# Patient Record
Sex: Female | Born: 1950 | Race: White | Hispanic: No | Marital: Single | Smoking: Never smoker
Health system: Southern US, Community
[De-identification: ages and names within clinical notes are randomized; demographics above are authoritative.]

## PROBLEM LIST (undated history)

## (undated) DIAGNOSIS — J349 Unspecified disorder of nose and nasal sinuses: Secondary | ICD-10-CM

## (undated) DIAGNOSIS — D0510 Intraductal carcinoma in situ of unspecified breast: Secondary | ICD-10-CM

## (undated) DIAGNOSIS — I10 Essential (primary) hypertension: Secondary | ICD-10-CM

## (undated) DIAGNOSIS — G5600 Carpal tunnel syndrome, unspecified upper limb: Secondary | ICD-10-CM

## (undated) DIAGNOSIS — M1990 Unspecified osteoarthritis, unspecified site: Secondary | ICD-10-CM

## (undated) HISTORY — DX: Carpal tunnel syndrome, unspecified upper limb: G56.00

## (undated) HISTORY — DX: Intraductal carcinoma in situ of unspecified breast: D05.10

## (undated) HISTORY — DX: Unspecified disorder of nose and nasal sinuses: J34.9

## (undated) HISTORY — DX: Unspecified osteoarthritis, unspecified site: M19.90

## (undated) HISTORY — DX: Essential (primary) hypertension: I10

## (undated) HISTORY — PX: CARPAL TUNNEL RELEASE: SHX101

## (undated) HISTORY — PX: TONSILLECTOMY: SUR1361

---

## 1998-02-17 ENCOUNTER — Other Ambulatory Visit: Admission: RE | Admit: 1998-02-17 | Discharge: 1998-02-17 | Payer: Self-pay | Admitting: Obstetrics and Gynecology

## 1999-05-06 ENCOUNTER — Ambulatory Visit (HOSPITAL_BASED_OUTPATIENT_CLINIC_OR_DEPARTMENT_OTHER): Admission: RE | Admit: 1999-05-06 | Discharge: 1999-05-06 | Payer: Self-pay | Admitting: Orthopedic Surgery

## 1999-05-31 ENCOUNTER — Other Ambulatory Visit: Admission: RE | Admit: 1999-05-31 | Discharge: 1999-05-31 | Payer: Self-pay | Admitting: Obstetrics and Gynecology

## 2000-07-16 ENCOUNTER — Other Ambulatory Visit: Admission: RE | Admit: 2000-07-16 | Discharge: 2000-07-16 | Payer: Self-pay | Admitting: Obstetrics and Gynecology

## 2000-12-30 ENCOUNTER — Emergency Department (HOSPITAL_COMMUNITY): Admission: EM | Admit: 2000-12-30 | Discharge: 2000-12-30 | Payer: Self-pay | Admitting: Emergency Medicine

## 2001-07-18 ENCOUNTER — Other Ambulatory Visit: Admission: RE | Admit: 2001-07-18 | Discharge: 2001-07-18 | Payer: Self-pay | Admitting: Obstetrics and Gynecology

## 2002-09-17 ENCOUNTER — Other Ambulatory Visit: Admission: RE | Admit: 2002-09-17 | Discharge: 2002-09-17 | Payer: Self-pay | Admitting: Obstetrics and Gynecology

## 2003-09-23 ENCOUNTER — Other Ambulatory Visit: Admission: RE | Admit: 2003-09-23 | Discharge: 2003-09-23 | Payer: Self-pay | Admitting: Obstetrics and Gynecology

## 2004-10-14 ENCOUNTER — Other Ambulatory Visit: Admission: RE | Admit: 2004-10-14 | Discharge: 2004-10-14 | Payer: Self-pay | Admitting: Obstetrics and Gynecology

## 2005-12-05 ENCOUNTER — Other Ambulatory Visit: Admission: RE | Admit: 2005-12-05 | Discharge: 2005-12-05 | Payer: Self-pay | Admitting: Obstetrics and Gynecology

## 2006-12-06 ENCOUNTER — Other Ambulatory Visit: Admission: RE | Admit: 2006-12-06 | Discharge: 2006-12-06 | Payer: Self-pay | Admitting: Obstetrics & Gynecology

## 2007-05-09 DIAGNOSIS — D0510 Intraductal carcinoma in situ of unspecified breast: Secondary | ICD-10-CM

## 2007-05-09 HISTORY — PX: BREAST LUMPECTOMY: SHX2

## 2007-05-09 HISTORY — DX: Intraductal carcinoma in situ of unspecified breast: D05.10

## 2007-11-18 ENCOUNTER — Encounter: Admission: RE | Admit: 2007-11-18 | Discharge: 2007-11-18 | Payer: Self-pay | Admitting: Radiology

## 2007-12-09 ENCOUNTER — Other Ambulatory Visit: Admission: RE | Admit: 2007-12-09 | Discharge: 2007-12-09 | Payer: Self-pay | Admitting: Obstetrics and Gynecology

## 2007-12-10 ENCOUNTER — Encounter (INDEPENDENT_AMBULATORY_CARE_PROVIDER_SITE_OTHER): Payer: Self-pay | Admitting: General Surgery

## 2007-12-10 ENCOUNTER — Ambulatory Visit (HOSPITAL_COMMUNITY): Admission: RE | Admit: 2007-12-10 | Discharge: 2007-12-10 | Payer: Self-pay | Admitting: General Surgery

## 2007-12-12 ENCOUNTER — Ambulatory Visit: Payer: Self-pay | Admitting: Hematology

## 2007-12-24 ENCOUNTER — Ambulatory Visit: Admission: RE | Admit: 2007-12-24 | Discharge: 2008-03-03 | Payer: Self-pay | Admitting: Radiation Oncology

## 2008-03-03 ENCOUNTER — Ambulatory Visit: Payer: Self-pay | Admitting: Hematology

## 2008-03-05 LAB — CBC WITH DIFFERENTIAL/PLATELET
BASO%: 0.5 % (ref 0.0–2.0)
HCT: 42 % (ref 34.8–46.6)
MCHC: 34.6 g/dL (ref 32.0–36.0)
MONO#: 0.3 10*3/uL (ref 0.1–0.9)
NEUT%: 64.8 % (ref 39.6–76.8)
WBC: 3.1 10*3/uL — ABNORMAL LOW (ref 3.9–10.0)
lymph#: 0.6 10*3/uL — ABNORMAL LOW (ref 0.9–3.3)

## 2008-03-05 LAB — COMPREHENSIVE METABOLIC PANEL
ALT: 11 U/L (ref 0–35)
Albumin: 4.2 g/dL (ref 3.5–5.2)
CO2: 28 mEq/L (ref 19–32)
Calcium: 9.3 mg/dL (ref 8.4–10.5)
Chloride: 105 mEq/L (ref 96–112)
Creatinine, Ser: 0.89 mg/dL (ref 0.40–1.20)
Sodium: 142 mEq/L (ref 135–145)
Total Protein: 6.5 g/dL (ref 6.0–8.3)

## 2008-05-04 ENCOUNTER — Ambulatory Visit: Payer: Self-pay | Admitting: Hematology

## 2008-05-06 LAB — COMPREHENSIVE METABOLIC PANEL
ALT: 11 U/L (ref 0–35)
BUN: 14 mg/dL (ref 6–23)
CO2: 29 mEq/L (ref 19–32)
Calcium: 8.8 mg/dL (ref 8.4–10.5)
Chloride: 105 mEq/L (ref 96–112)
Creatinine, Ser: 0.87 mg/dL (ref 0.40–1.20)
Glucose, Bld: 95 mg/dL (ref 70–99)

## 2008-05-06 LAB — CBC WITH DIFFERENTIAL/PLATELET
Basophils Absolute: 0 10*3/uL (ref 0.0–0.1)
Eosinophils Absolute: 0.1 10*3/uL (ref 0.0–0.5)
HCT: 40.5 % (ref 34.8–46.6)
HGB: 13.8 g/dL (ref 11.6–15.9)
MONO#: 0.3 10*3/uL (ref 0.1–0.9)
NEUT%: 62.8 % (ref 39.6–76.8)
WBC: 3.1 10*3/uL — ABNORMAL LOW (ref 3.9–10.0)
lymph#: 0.7 10*3/uL — ABNORMAL LOW (ref 0.9–3.3)

## 2008-08-03 ENCOUNTER — Ambulatory Visit: Payer: Self-pay | Admitting: Hematology

## 2008-08-06 LAB — CBC WITH DIFFERENTIAL/PLATELET
Basophils Absolute: 0 10*3/uL (ref 0.0–0.1)
EOS%: 2.8 % (ref 0.0–7.0)
HCT: 41.1 % (ref 34.8–46.6)
HGB: 14.1 g/dL (ref 11.6–15.9)
LYMPH%: 32.2 % (ref 14.0–49.7)
MCH: 30.5 pg (ref 25.1–34.0)
MONO#: 0.3 10*3/uL (ref 0.1–0.9)
NEUT%: 55.5 % (ref 38.4–76.8)
Platelets: 192 10*3/uL (ref 145–400)
lymph#: 1 10*3/uL (ref 0.9–3.3)

## 2008-08-06 LAB — COMPREHENSIVE METABOLIC PANEL
BUN: 16 mg/dL (ref 6–23)
CO2: 29 mEq/L (ref 19–32)
Calcium: 9.4 mg/dL (ref 8.4–10.5)
Chloride: 104 mEq/L (ref 96–112)
Creatinine, Ser: 0.95 mg/dL (ref 0.40–1.20)
Total Bilirubin: 0.4 mg/dL (ref 0.3–1.2)

## 2008-12-14 ENCOUNTER — Ambulatory Visit: Payer: Self-pay | Admitting: Hematology

## 2008-12-16 LAB — COMPREHENSIVE METABOLIC PANEL
AST: 14 U/L (ref 0–37)
BUN: 14 mg/dL (ref 6–23)
Calcium: 8.9 mg/dL (ref 8.4–10.5)
Chloride: 104 mEq/L (ref 96–112)
Creatinine, Ser: 0.87 mg/dL (ref 0.40–1.20)
Total Bilirubin: 0.4 mg/dL (ref 0.3–1.2)

## 2008-12-16 LAB — CBC WITH DIFFERENTIAL/PLATELET
BASO%: 0.5 % (ref 0.0–2.0)
Basophils Absolute: 0 10*3/uL (ref 0.0–0.1)
EOS%: 2.4 % (ref 0.0–7.0)
HCT: 41.4 % (ref 34.8–46.6)
HGB: 14 g/dL (ref 11.6–15.9)
LYMPH%: 29.5 % (ref 14.0–49.7)
MCH: 29.8 pg (ref 25.1–34.0)
MCHC: 33.8 g/dL (ref 31.5–36.0)
MCV: 88.3 fL (ref 79.5–101.0)
MONO%: 7.4 % (ref 0.0–14.0)
NEUT%: 60.2 % (ref 38.4–76.8)
Platelets: 166 10*3/uL (ref 145–400)
lymph#: 1 10*3/uL (ref 0.9–3.3)

## 2009-04-08 ENCOUNTER — Ambulatory Visit: Payer: Self-pay | Admitting: Oncology

## 2009-04-12 LAB — COMPREHENSIVE METABOLIC PANEL
ALT: 10 U/L (ref 0–35)
AST: 13 U/L (ref 0–37)
Alkaline Phosphatase: 43 U/L (ref 39–117)
BUN: 16 mg/dL (ref 6–23)
Chloride: 104 mEq/L (ref 96–112)
Creatinine, Ser: 0.83 mg/dL (ref 0.40–1.20)

## 2009-04-12 LAB — CBC WITH DIFFERENTIAL/PLATELET
BASO%: 0.5 % (ref 0.0–2.0)
Basophils Absolute: 0 10*3/uL (ref 0.0–0.1)
EOS%: 3.2 % (ref 0.0–7.0)
HCT: 40.4 % (ref 34.8–46.6)
HGB: 13.8 g/dL (ref 11.6–15.9)
MCH: 30.7 pg (ref 25.1–34.0)
MCHC: 34.3 g/dL (ref 31.5–36.0)
MCV: 89.6 fL (ref 79.5–101.0)
MONO%: 9.4 % (ref 0.0–14.0)
NEUT%: 53.5 % (ref 38.4–76.8)
RDW: 13.6 % (ref 11.2–14.5)
lymph#: 1.2 10*3/uL (ref 0.9–3.3)

## 2009-07-27 ENCOUNTER — Ambulatory Visit: Payer: Self-pay | Admitting: Oncology

## 2009-07-29 LAB — COMPREHENSIVE METABOLIC PANEL
ALT: 10 U/L (ref 0–35)
AST: 14 U/L (ref 0–37)
Albumin: 3.9 g/dL (ref 3.5–5.2)
BUN: 13 mg/dL (ref 6–23)
Calcium: 9.2 mg/dL (ref 8.4–10.5)
Chloride: 106 mEq/L (ref 96–112)
Potassium: 4 mEq/L (ref 3.5–5.3)
Sodium: 141 mEq/L (ref 135–145)
Total Protein: 6.5 g/dL (ref 6.0–8.3)

## 2009-07-29 LAB — CBC WITH DIFFERENTIAL/PLATELET
Basophils Absolute: 0 10*3/uL (ref 0.0–0.1)
EOS%: 3.3 % (ref 0.0–7.0)
HGB: 13.8 g/dL (ref 11.6–15.9)
MCH: 30.3 pg (ref 25.1–34.0)
NEUT#: 1.5 10*3/uL (ref 1.5–6.5)
RBC: 4.53 10*6/uL (ref 3.70–5.45)
RDW: 13.1 % (ref 11.2–14.5)
lymph#: 1.2 10*3/uL (ref 0.9–3.3)

## 2009-11-01 ENCOUNTER — Ambulatory Visit: Payer: Self-pay | Admitting: Oncology

## 2009-11-03 LAB — COMPREHENSIVE METABOLIC PANEL
AST: 18 U/L (ref 0–37)
Albumin: 4 g/dL (ref 3.5–5.2)
BUN: 15 mg/dL (ref 6–23)
CO2: 26 mEq/L (ref 19–32)
Calcium: 9.4 mg/dL (ref 8.4–10.5)
Glucose, Bld: 86 mg/dL (ref 70–99)
Sodium: 142 mEq/L (ref 135–145)

## 2009-11-03 LAB — CBC WITH DIFFERENTIAL/PLATELET
BASO%: 0.5 % (ref 0.0–2.0)
EOS%: 2.4 % (ref 0.0–7.0)
HCT: 40.2 % (ref 34.8–46.6)
LYMPH%: 41.4 % (ref 14.0–49.7)
MCH: 30.3 pg (ref 25.1–34.0)
MCHC: 34.4 g/dL (ref 31.5–36.0)
MCV: 88.1 fL (ref 79.5–101.0)
MONO#: 0.3 10*3/uL (ref 0.1–0.9)
MONO%: 9.3 % (ref 0.0–14.0)
NEUT%: 46.4 % (ref 38.4–76.8)
Platelets: 174 10*3/uL (ref 145–400)
RBC: 4.56 10*6/uL (ref 3.70–5.45)
WBC: 3.4 10*3/uL — ABNORMAL LOW (ref 3.9–10.3)

## 2010-02-02 ENCOUNTER — Ambulatory Visit (HOSPITAL_BASED_OUTPATIENT_CLINIC_OR_DEPARTMENT_OTHER): Payer: Federal, State, Local not specified - PPO | Admitting: Oncology

## 2010-02-04 LAB — CBC WITH DIFFERENTIAL/PLATELET
Basophils Absolute: 0 10*3/uL (ref 0.0–0.1)
Eosinophils Absolute: 0.1 10*3/uL (ref 0.0–0.5)
HCT: 38.2 % (ref 34.8–46.6)
LYMPH%: 41.6 % (ref 14.0–49.7)
MCV: 88.3 fL (ref 79.5–101.0)
MONO#: 0.3 10*3/uL (ref 0.1–0.9)
MONO%: 9.6 % (ref 0.0–14.0)
NEUT#: 1.2 10*3/uL — ABNORMAL LOW (ref 1.5–6.5)
NEUT%: 45.6 % (ref 38.4–76.8)
Platelets: 166 10*3/uL (ref 145–400)
RBC: 4.32 10*6/uL (ref 3.70–5.45)
WBC: 2.7 10*3/uL — ABNORMAL LOW (ref 3.9–10.3)

## 2010-06-09 ENCOUNTER — Encounter (HOSPITAL_BASED_OUTPATIENT_CLINIC_OR_DEPARTMENT_OTHER): Payer: Federal, State, Local not specified - PPO | Admitting: Oncology

## 2010-06-09 DIAGNOSIS — D72819 Decreased white blood cell count, unspecified: Secondary | ICD-10-CM

## 2010-06-09 DIAGNOSIS — D0590 Unspecified type of carcinoma in situ of unspecified breast: Secondary | ICD-10-CM

## 2010-06-09 DIAGNOSIS — Z170 Estrogen receptor positive status [ER+]: Secondary | ICD-10-CM

## 2010-06-09 DIAGNOSIS — Z23 Encounter for immunization: Secondary | ICD-10-CM

## 2010-06-09 LAB — CBC WITH DIFFERENTIAL/PLATELET
BASO%: 0.7 % (ref 0.0–2.0)
HCT: 39.8 % (ref 34.8–46.6)
LYMPH%: 36.5 % (ref 14.0–49.7)
MCHC: 33.9 g/dL (ref 31.5–36.0)
MCV: 87.7 fL (ref 79.5–101.0)
MONO%: 8.3 % (ref 0.0–14.0)
NEUT%: 51.1 % (ref 38.4–76.8)
Platelets: 185 10*3/uL (ref 145–400)
RBC: 4.55 10*6/uL (ref 3.70–5.45)

## 2010-06-09 LAB — COMPREHENSIVE METABOLIC PANEL
Alkaline Phosphatase: 40 U/L (ref 39–117)
CO2: 28 mEq/L (ref 19–32)
Creatinine, Ser: 0.9 mg/dL (ref 0.40–1.20)
Glucose, Bld: 96 mg/dL (ref 70–99)
Sodium: 142 mEq/L (ref 135–145)
Total Bilirubin: 0.5 mg/dL (ref 0.3–1.2)
Total Protein: 6.4 g/dL (ref 6.0–8.3)

## 2010-09-16 ENCOUNTER — Encounter (INDEPENDENT_AMBULATORY_CARE_PROVIDER_SITE_OTHER): Payer: Self-pay | Admitting: General Surgery

## 2010-09-20 NOTE — Op Note (Signed)
Bonnie Collins, Bonnie Collins             ACCOUNT NO.:  [id number]   MEDICAL RECORD NO.:  [id number]          PATIENT TYPE:  AMB   LOCATION:  SDS                          FACILITY:  [facility]   PHYSICIAN:  Angelia Mould. Derrell Lolling, M.D.DATE OF BIRTH:  13-Nov-1950   DATE OF PROCEDURE:  12/10/2007  DATE OF DISCHARGE:                               OPERATIVE REPORT   PREOPERATIVE DIAGNOSIS:  Ductal carcinoma in situ, left breast.   POSTOPERATIVE DIAGNOSIS:  Ductal carcinoma in situ, left breast.   OPERATION PERFORMED:  Left partial mastectomy with needle localization  and specimen mammogram.   SURGEON:  Angelia Mould. Derrell Lolling, MD   OPERATIVE INDICATIONS:  This is a 60 year old white female who had  screening mammograms recently, which showed focal area of cluster  microcalcifications in the upper outer quadrant of the left breast.  Breast specific gamma imaging showed a very faint area in the upper  outer quadrant.  MRI showed a solitary area in the upper outer quadrant  correlating with her known DCIS being 1.4 cm.  Nodes were negative.  It  was her desire to have breast conservation surgery.  She is brought to  the operating room electively.   OPERATIVE TECHNIQUE:  The patient underwent needle localization by  Jeralyn Ruths this morning and that went well.  She is brought to  operating room.  A general anesthesia was induced.  The left breast was  prepped and draped in the sterile fashion.  The patient was identified  as correct patient and correct procedure.  Intravenous antibiotics were  given.   I observed the images that had the wire in place entering in the far  lateral area at about 10 o'clock position and directed medially.  Using  the marking pen, I marked a circumareolar incision in the far lateral  upper outer quadrant.  0.5% Marcaine with epinephrine was used as local  infiltration anesthetic.  A curved circumareolar incision was made in  the far lateral left breast.  Dissection was  carried down into the  breast tissue and using electrocautery, I dissected widely around the  localizing wire.  The specimen was removed and marked with a 6-color  margin marker kit.  Specimen mammogram was performed by Dr. Yolanda Bonine,  and she said that the marker clip was in the center of the specimen.  The specimen was then sent for routine histology.  The wound was  irrigated with saline.  Hemostasis was excellent and achieved  electrocautery.  The deeper breast tissue was closed with interrupted  sutures of 3-0 Vicryl and skin closed with running subcuticular suture  of 4-0 Monocryl and Dermabond.  Clean bandages were placed, and the  patient taken to the recovery room in stable condition.  Estimated blood  loss was about 10 mL.  Complications, none.  Sponge, needle, and  instrument counts were correct.     Angelia Mould. Derrell Lolling, M.D.  Electronically Signed    HMI/MEDQ  D:  12/10/2007  T:  12/11/2007  Job:  [id number]   cc:   Gloriajean Dell. Andrey Campanile, M.D.

## 2010-10-20 ENCOUNTER — Other Ambulatory Visit: Payer: Self-pay | Admitting: Oncology

## 2010-10-20 ENCOUNTER — Encounter (HOSPITAL_BASED_OUTPATIENT_CLINIC_OR_DEPARTMENT_OTHER): Payer: Federal, State, Local not specified - PPO | Admitting: Oncology

## 2010-10-20 DIAGNOSIS — D0590 Unspecified type of carcinoma in situ of unspecified breast: Secondary | ICD-10-CM

## 2010-10-20 DIAGNOSIS — D72819 Decreased white blood cell count, unspecified: Secondary | ICD-10-CM

## 2010-10-20 LAB — COMPREHENSIVE METABOLIC PANEL
ALT: 10 U/L (ref 0–35)
AST: 16 U/L (ref 0–37)
Albumin: 4.1 g/dL (ref 3.5–5.2)
Alkaline Phosphatase: 40 U/L (ref 39–117)
Calcium: 9.2 mg/dL (ref 8.4–10.5)
Chloride: 105 mEq/L (ref 96–112)
Potassium: 3.9 mEq/L (ref 3.5–5.3)

## 2010-10-20 LAB — CBC WITH DIFFERENTIAL/PLATELET
BASO%: 0.4 % (ref 0.0–2.0)
EOS%: 2.6 % (ref 0.0–7.0)
MCH: 29.6 pg (ref 25.1–34.0)
MCHC: 33.8 g/dL (ref 31.5–36.0)
MCV: 87.7 fL (ref 79.5–101.0)
MONO%: 8.7 % (ref 0.0–14.0)
RBC: 4.53 10*6/uL (ref 3.70–5.45)
RDW: 14.1 % (ref 11.2–14.5)
lymph#: 1.1 10*3/uL (ref 0.9–3.3)

## 2010-12-22 ENCOUNTER — Ambulatory Visit (INDEPENDENT_AMBULATORY_CARE_PROVIDER_SITE_OTHER): Payer: Federal, State, Local not specified - PPO | Admitting: General Surgery

## 2010-12-22 ENCOUNTER — Encounter (INDEPENDENT_AMBULATORY_CARE_PROVIDER_SITE_OTHER): Payer: Self-pay | Admitting: General Surgery

## 2010-12-22 VITALS — BP 122/84 | Temp 97.6°F | Wt 229.8 lb

## 2010-12-22 DIAGNOSIS — Z853 Personal history of malignant neoplasm of breast: Secondary | ICD-10-CM

## 2010-12-22 NOTE — Patient Instructions (Signed)
Your physical exam today is unremarkable, there is no sign of recurrent cancer anywhere. Your mammograms on October 31, 2010 looked good there is no focal abnormality. Continue taking the tamoxifen. Continue followup with Dr. Gaylyn Rong.  I will see you in one year after you gets your annual mammograms.

## 2010-12-22 NOTE — Progress Notes (Signed)
Chief Complaint  Patient presents with  . Breast Cancer Long Term Follow Up    DCIS left breast, UOQ    HPI Bonnie Collins is a 60 y.o. female.  This 59 year old Caucasian female underwent left partial mastectomy on December 10, 2007. She had ductal carcinoma in situ, intermediate grade, receptor positive. She has no known recurrence to date. She has no complaints about her breast.  She sees Dr. Gaylyn Rong on a regular basis, approximately every 4 months. She is tolerating tamoxifen. Recent mammograms on October 31, 2010 looked fine. No focal abnormality. HPI  Past Medical History  Diagnosis Date  . Carpal tunnel syndrome   . Sinus problem   . Lump in female breast     Past Surgical History  Procedure Date  . Carpal tunnel release 1993 & 2003  . Breast lumpectomy 2009    Family History  Problem Relation Age of Onset  . Diabetes Mother   . Heart disease Father     Social History History  Substance Use Topics  . Smoking status: Never Smoker   . Smokeless tobacco: Not on file  . Alcohol Use: Yes     OCCASIONALLY    No Known Allergies  Current Outpatient Prescriptions  Medication Sig Dispense Refill  . Calcium-Vitamin D-Vitamin K 500-500-40 MG-UNT-MCG CHEW Chew by mouth.        . Cetirizine HCl (ZYRTEC PO) Take by mouth.        . Multiple Vitamin (MULTIVITAMIN PO) Take by mouth. MULTIVITAMIN FOR WOMEN OVER 50       . NATURAL PSYLLIUM FIBER PO Take by mouth.        . Omega-3 Fatty Acids (FISH OIL) 1000 MG CAPS Take by mouth.        . tamoxifen (NOLVADEX) 20 MG tablet Take 20 mg by mouth 2 (two) times daily.          Review of Systems ROS 10 system review is systems is performed and is negative except as described above.Blood pressure 122/84, temperature 97.6 F (36.4 C), temperature source Temporal, weight 229 lb 12.8 oz (104.237 kg).  Physical Exam Physical Exam   Patient looks well and is in no distress.  Neck no adenopathy no mass or jugular venous  distention.  Lungs clear to auscultation.  Breasts- oth breasts are examined. There is a well-healed lumpectomy scar in the upper outer quadrant left breast. Otherwise there are no abnormalities, no palpable masses, no skin changes no axillary adenopathy.  Data Reviewed  Mammograms reviewed. Medical oncology notes reviewed.  Assessment    Ductal carcinoma in situ left breast, intermediate grade, receptor positive.  No evidence of recurrence 3 years following left partial mastectomy, adjuvant radiation therapy, and adjuvant tamoxifen therapy.    Plan    Continue tamoxifen. Continued followup with Dr. Gaylyn Rong.  Repeat mammograms in one year.  Ather request, I will see her annually.       Bonnie Collins M 12/22/2010, 12:11 PM

## 2011-02-03 LAB — COMPREHENSIVE METABOLIC PANEL
ALT: 17
Albumin: 4.1
Alkaline Phosphatase: 67
GFR calc Af Amer: >60
Potassium: 4.3
Sodium: 140
Total Protein: 7.1

## 2011-02-03 LAB — CANCER ANTIGEN 27.29: CA 27.29: 21

## 2011-02-03 LAB — CBC
Platelets: 200
RDW: 13.7

## 2011-02-03 LAB — URINALYSIS, ROUTINE W REFLEX MICROSCOPIC
Glucose, UA: NEGATIVE
Ketones, ur: NEGATIVE
Protein, ur: NEGATIVE
Urobilinogen, UA: 0.2

## 2011-02-03 LAB — DIFFERENTIAL
Basophils Absolute: 0
Basophils Relative: 1
Eosinophils Absolute: 0.1
Neutro Abs: 2.8
Neutrophils Relative %: 52

## 2011-02-03 LAB — URINE MICROSCOPIC-ADD ON

## 2011-02-03 LAB — LACTATE DEHYDROGENASE: LDH: 164

## 2011-03-10 ENCOUNTER — Encounter: Payer: Self-pay | Admitting: Oncology

## 2011-03-10 DIAGNOSIS — D0510 Intraductal carcinoma in situ of unspecified breast: Secondary | ICD-10-CM | POA: Insufficient documentation

## 2011-03-23 ENCOUNTER — Ambulatory Visit (HOSPITAL_BASED_OUTPATIENT_CLINIC_OR_DEPARTMENT_OTHER): Payer: Federal, State, Local not specified - PPO | Admitting: Oncology

## 2011-03-23 ENCOUNTER — Other Ambulatory Visit: Payer: Self-pay | Admitting: Oncology

## 2011-03-23 ENCOUNTER — Telehealth: Payer: Self-pay | Admitting: Oncology

## 2011-03-23 ENCOUNTER — Other Ambulatory Visit (HOSPITAL_BASED_OUTPATIENT_CLINIC_OR_DEPARTMENT_OTHER): Payer: Federal, State, Local not specified - PPO | Admitting: Lab

## 2011-03-23 VITALS — BP 130/82 | HR 69 | Temp 97.2°F | Ht 66.5 in | Wt 226.5 lb

## 2011-03-23 DIAGNOSIS — D709 Neutropenia, unspecified: Secondary | ICD-10-CM

## 2011-03-23 DIAGNOSIS — D0590 Unspecified type of carcinoma in situ of unspecified breast: Secondary | ICD-10-CM

## 2011-03-23 DIAGNOSIS — Z853 Personal history of malignant neoplasm of breast: Secondary | ICD-10-CM

## 2011-03-23 LAB — CBC WITH DIFFERENTIAL/PLATELET
BASO%: 1 % (ref 0.0–2.0)
Eosinophils Absolute: 0.1 10*3/uL (ref 0.0–0.5)
HCT: 39.1 % (ref 34.8–46.6)
LYMPH%: 40.4 % (ref 14.0–49.7)
MCHC: 33.7 g/dL (ref 31.5–36.0)
MCV: 88.8 fL (ref 79.5–101.0)
MONO#: 0.3 10*3/uL (ref 0.1–0.9)
MONO%: 10.5 % (ref 0.0–14.0)
NEUT%: 45.5 % (ref 38.4–76.8)
Platelets: 165 10*3/uL (ref 145–400)
RBC: 4.41 10*6/uL (ref 3.70–5.45)
WBC: 3.1 10*3/uL — ABNORMAL LOW (ref 3.9–10.3)

## 2011-03-23 LAB — COMPREHENSIVE METABOLIC PANEL
Alkaline Phosphatase: 44 U/L (ref 39–117)
CO2: 30 mEq/L (ref 19–32)
Creatinine, Ser: 0.87 mg/dL (ref 0.50–1.10)
Glucose, Bld: 78 mg/dL (ref 70–99)
Total Bilirubin: 0.4 mg/dL (ref 0.3–1.2)

## 2011-03-23 NOTE — Telephone Encounter (Signed)
gv pt appt schedule for may.  °

## 2011-03-23 NOTE — Progress Notes (Signed)
[facility] OFFICE PROGRESS NOTE  Pamelia Hoit, MD [address] [facility] [place] [address]   DIAGNOSIS:  History of multifocal intermediate grade ductal carcinoma in situ, measured total of 0.3 cm on lumpectomy dated 12/10/2007.  There was no evidence of invasive carcinoma.  Margins were negative.  ER 100%, PR 36%.  PRIOR THERAPY:  Status post lumpectomy, status post adjuvant radiation therapy.    CURRENT THERAPY:  Tamoxifen 20 mg p.o. daily started on 03/05/18.  INTERVAL HISTORY: Bonnie Collins 60 y.o. female returns for regular follow up.  She is taking tamoxifen without problem. She denies anorexia, weight loss, headache, drenching night sweat, cough, hemoptysis, mucositis, nausea vomiting, breast abnormality, abdominal pain, abdominal swelling, hematochezia, hematuria, vaginal bleeding, vaginal spotting, lower cavity swelling or pain.  She is very active. She recently adopted a strayed dog. She walks the dog everyday.  She goes to the [facility] and works out 5 times a week.  She denies any recurrent infections such as sinus pain, productive cough, skin abscess, skin rash, dysuria, hematuria, pyuria, diarrhea.   MEDICAL HISTORY: Past Medical History  Diagnosis Date  . Carpal tunnel syndrome   . Sinus problem   . DCIS (ductal carcinoma in situ) 2009    s/p lumpectomy; s/p adj XRT; on adj Tamoxifen    ALLERGIES:   has no known allergies.  MEDICATIONS: Current Outpatient Prescriptions  Medication Sig Dispense Refill  . Calcium-Vitamin D-Vitamin K 500-500-40 MG-UNT-MCG CHEW Chew by mouth.        . Cetirizine HCl (ZYRTEC PO) Take by mouth daily as needed.       . Multiple Vitamin (MULTIVITAMIN PO) Take by mouth. MULTIVITAMIN FOR WOMEN OVER 50       . NATURAL PSYLLIUM FIBER PO Take by mouth.        . Omega-3 Fatty Acids (FISH OIL) 1000 MG CAPS Take by mouth.        . tamoxifen (NOLVADEX) 20 MG tablet Take 20 mg by mouth 2 (two) times daily.           SURGICAL HISTORY:  Past Surgical History  Procedure Date  . Carpal tunnel release 1993 & 2003  . Breast lumpectomy 2009    REVIEW OF SYSTEMS:  Pertinent items are noted in HPI.   Filed Vitals:   03/23/11 1032  BP: 130/82  Pulse: 69  Temp: 97.2 F (36.2 C)   Wt Readings from Last 3 Encounters:  03/23/11 226 lb 8 oz (102.74 kg)  10/20/10 228 lb 5 oz (103.562 kg)  12/22/10 229 lb 12.8 oz (104.237 kg)    PHYSICAL EXAMINATION:  General:  well-nourished in no acute distress.  Eyes:  no scleral icterus.  ENT:  There were no oropharyngeal lesions.  Neck was without thyromegaly.  Lymphatics:  Negative cervical, supraclavicular or axillary adenopathy.  Respiratory: lungs were clear bilaterally without wheezing or crackles.  Cardiovascular:  Regular rate and rhythm, S1/S2, without murmur, rub or gallop.  There was no pedal edema.  GI:  abdomen was soft, flat, nontender, nondistended, without organomegaly.  Muscoloskeletal:  no spinal tenderness of palpation of vertebral spine.  Skin exam was without echymosis, petichae.  Neuro exam was nonfocal.  Patient was able to get on and off exam table without assistance.  Gait was normal.  Patient was alerted and oriented.  Attention was good.   Language was appropriate.  Mood was normal without depression.  Speech was not pressured.  Thought content was not tangential.  Bilateral breast exam was negative.   LABORATORY/RADIOLOGY DATA:  Lab Results  Component Value Date   WBC 3.1* 03/23/2011   HGB 13.2 03/23/2011   HCT 39.1 03/23/2011   PLT 165 03/23/2011   GLUCOSE 87 10/20/2010   GLUCOSE 87 10/20/2010   ALT 10 10/20/2010   ALT 10 10/20/2010   AST 16 10/20/2010   AST 16 10/20/2010   NA 141 10/20/2010   NA 141 10/20/2010   K 3.9 10/20/2010   K 3.9 10/20/2010   CL 105 10/20/2010   CL 105 10/20/2010   CREATININE 0.92 10/20/2010   CREATININE 0.92 10/20/2010   BUN 16 10/20/2010   BUN 16 10/20/2010   CO2 28 10/20/2010   CO2 28 10/20/2010   LAST MAMMO:   October 31, 2010 with [facility] was negative.     ASSESSMENT AND PLAN: 1.  History of ductal carcinoma in situ.  I discussed with Ms. Mesina that she is doing well on adjuvant tamoxifen.  The patient has no evidence of disease recurrence or metastatic disease.  I recommend continuing the tamoxifen 20 mg p.o. daily for 5 years total that is due to finish in October 2014. 2. Surveillance.  She is due for bilateral mammogram late June 2013 with [facility] which will be scheduled next time she is here.  3. Neutropenia.  This is slightly worse than before.  Her ANC is 1.4 today.   She has no recurrent infection.  The patient has no B symptoms.  She does not have pancytopenia.  Will continue watching her CBC each time she is here.  This is most likely due to tamoxifen.  I still have low pretest probability for a primary bone marrow failure state or myeloproliferative disease.  I advised her that I will continue to recommend observation and keep and eye on her CBC each time she is here. In the future if ANC is less than 1 or she develops pancytopenia, then I may consider bone marrow biopsy for diagnosis. She expressed informed understanding and agreed with the stated plan. 4. Primary care.  Colonoscopy is due in 2014.  She is up-to-date with Pap smear with [facility]; next one is due 2014.  Follow up with me in 6 months.  She had already had a flu shot this year.   She is not smoking or consuming EtOH heavily.

## 2011-08-07 ENCOUNTER — Telehealth: Payer: Self-pay | Admitting: Oncology

## 2011-08-07 NOTE — Telephone Encounter (Signed)
l/m with 09/20/11 f.u appt aom

## 2011-09-20 ENCOUNTER — Telehealth: Payer: Self-pay | Admitting: Oncology

## 2011-09-20 ENCOUNTER — Ambulatory Visit (HOSPITAL_BASED_OUTPATIENT_CLINIC_OR_DEPARTMENT_OTHER): Payer: Federal, State, Local not specified - PPO | Admitting: Oncology

## 2011-09-20 ENCOUNTER — Other Ambulatory Visit: Payer: Federal, State, Local not specified - PPO | Admitting: Lab

## 2011-09-20 VITALS — BP 127/83 | HR 73 | Temp 97.2°F | Ht 66.5 in | Wt 227.4 lb

## 2011-09-20 DIAGNOSIS — D0510 Intraductal carcinoma in situ of unspecified breast: Secondary | ICD-10-CM

## 2011-09-20 DIAGNOSIS — D0590 Unspecified type of carcinoma in situ of unspecified breast: Secondary | ICD-10-CM

## 2011-09-20 DIAGNOSIS — Z170 Estrogen receptor positive status [ER+]: Secondary | ICD-10-CM

## 2011-09-20 DIAGNOSIS — Z853 Personal history of malignant neoplasm of breast: Secondary | ICD-10-CM

## 2011-09-20 DIAGNOSIS — Z79810 Long term (current) use of selective estrogen receptor modulators (SERMs): Secondary | ICD-10-CM

## 2011-09-20 LAB — CBC WITH DIFFERENTIAL/PLATELET
Eosinophils Absolute: 0.1 10*3/uL (ref 0.0–0.5)
MCV: 88.7 fL (ref 79.5–101.0)
MONO#: 0.3 10*3/uL (ref 0.1–0.9)
MONO%: 9.3 % (ref 0.0–14.0)
NEUT#: 1.4 10*3/uL — ABNORMAL LOW (ref 1.5–6.5)
RBC: 4.36 10*6/uL (ref 3.70–5.45)
RDW: 13.9 % (ref 11.2–14.5)
WBC: 2.9 10*3/uL — ABNORMAL LOW (ref 3.9–10.3)
lymph#: 1.1 10*3/uL (ref 0.9–3.3)
nRBC: 0 % (ref 0–0)

## 2011-09-20 LAB — COMPREHENSIVE METABOLIC PANEL
Albumin: 3.8 g/dL (ref 3.5–5.2)
BUN: 15 mg/dL (ref 6–23)
CO2: 28 mEq/L (ref 19–32)
Calcium: 8.9 mg/dL (ref 8.4–10.5)
Glucose, Bld: 99 mg/dL (ref 70–99)
Potassium: 4.1 mEq/L (ref 3.5–5.3)
Sodium: 141 mEq/L (ref 135–145)
Total Protein: 6.1 g/dL (ref 6.0–8.3)

## 2011-09-20 NOTE — Telephone Encounter (Signed)
Pt already has her mammo made for June 26.   Other appts made and printed for pt aom

## 2011-09-20 NOTE — Progress Notes (Signed)
[facility]  Telephone:([phone] [phone]   OFFICE PROGRESS NOTE   Cc:  Pamelia Hoit, MD, MD  DIAGNOSIS: History of multifocal intermediate grade ductal carcinoma in situ, measured total of 0.3 cm on lumpectomy dated 12/10/2007. There was no evidence of invasive carcinoma. Margins were negative. ER 100%, PR 36%.   PRIOR THERAPY: Status post lumpectomy, status post adjuvant radiation therapy.   CURRENT THERAPY: Tamoxifen 20 mg p.o. daily started on 03/05/18.   INTERVAL HISTORY: Bonnie Collins 61 y.o. female returns for regular follow up.  She reports feeling well.  She has vaginal dryness.  She has not been using any ointment/lubricants.  She is taking tamoxifen with mild hot flash during the day.  She denies any palpable breast mass.   Patient denies fatigue, headache, visual changes, confusion, drenching night sweats, palpable lymph node swelling, mucositis, odynophagia, dysphagia, nausea vomiting, jaundice, chest pain, palpitation, shortness of breath, dyspnea on exertion, productive cough, gum bleeding, epistaxis, hematemesis, hemoptysis, abdominal pain, abdominal swelling, early satiety, melena, hematochezia, hematuria, skin rash, spontaneous bleeding, joint swelling, joint pain, heat or cold intolerance, bowel bladder incontinence, back pain, focal motor weakness, paresthesia, depression, suicidal or homocidal ideation, feeling hopelessness.   Past Medical History  Diagnosis Date  . Carpal tunnel syndrome   . Sinus problem   . DCIS (ductal carcinoma in situ) 2009    s/p lumpectomy; s/p adj XRT; on adj Tamoxifen    Past Surgical History  Procedure Date  . Carpal tunnel release 1993 & 2003  . Breast lumpectomy 2009    Current Outpatient Prescriptions  Medication Sig Dispense Refill  . Calcium-Vitamin D-Vitamin K 500-500-40 MG-UNT-MCG CHEW Chew 1 tablet by mouth 2 (two) times daily.       . Cetirizine HCl (ZYRTEC PO) Take by mouth daily  as needed.       . Multiple Vitamin (MULTIVITAMIN PO) Take by mouth. MULTIVITAMIN FOR WOMEN OVER 50       . NATURAL PSYLLIUM FIBER PO Take by mouth.        . Omega-3 Fatty Acids (FISH OIL) 1000 MG CAPS Take 1 capsule by mouth daily.       . tamoxifen (NOLVADEX) 20 MG tablet Take 20 mg by mouth daily.         ALLERGIES:   has no known allergies.  REVIEW OF SYSTEMS:  The rest of the 14-point review of system was negative.   Filed Vitals:   09/20/11 1005  BP: 127/83  Pulse: 73  Temp: 97.2 F (36.2 C)   Wt Readings from Last 3 Encounters:  09/20/11 227 lb 6.4 oz (103.148 kg)  03/23/11 226 lb 8 oz (102.74 kg)  10/20/10 228 lb 5 oz (103.562 kg)   ECOG Performance status: 0  PHYSICAL EXAMINATION:    General: well-nourished in no acute distress. Eyes: no scleral icterus. ENT: There were no oropharyngeal lesions. Neck was without thyromegaly. Lymphatics: Negative cervical, supraclavicular or axillary adenopathy. Respiratory: lungs were clear bilaterally without wheezing or crackles. Cardiovascular: Regular rate and rhythm, S1/S2, without murmur, rub or gallop. There was no pedal edema. GI: abdomen was soft, flat, nontender, nondistended, without organomegaly. Muscoloskeletal: no spinal tenderness of palpation of vertebral spine. Skin exam was without echymosis, petichae. Neuro exam was nonfocal. Patient was able to get on and off exam table without assistance. Gait was normal. Patient was alerted and oriented. Attention was good. Language was appropriate. Mood was normal without depression. Speech was not pressured. Thought content was not  tangential. Bilateral breast exam was negative for any palpable breast mass, erythema, skin thickening, purulent discharge.     LABORATORY/RADIOLOGY DATA:  Lab Results  Component Value Date   WBC 2.9* 09/20/2011   HGB 13.0 09/20/2011   HCT 38.7 09/20/2011   PLT 170 09/20/2011   GLUCOSE 78 03/23/2011   ALKPHOS 44 03/23/2011   ALT 11 03/23/2011   AST 19  03/23/2011   NA 142 03/23/2011   K 4.2 03/23/2011   CL 106 03/23/2011   CREATININE 0.87 03/23/2011   BUN 15 03/23/2011   CO2 30 03/23/2011    ASSESSMENT AND PLAN:   1. History of ductal carcinoma in situ. Bonnie Collins is doing well on adjuvant tamoxifen. She has no evidence of disease recurrence or metastatic disease. She has grade 1-2 vaginal dryness.  I recommended continuing the tamoxifen 20 mg p.o. daily for 5 years total that is due to finish in October 2014.   2. Surveillance. She is due for bilateral mammogram late June 2013 which I requested today.  3. Neutropenia. Stable.  ANC is 1.4 today without symptoms of recurrent infection.  She does not have anemia or thrombocytopenia.  I recommended watchful observation.  In the future, if her ANC continues to decline to <1 or she also has anemia and thrombocytopenia, then we may consider a diagnostic bone marrow biopsy.  4. Age-appropriate cancer screening:  Colonoscopy is due in 2014. She is up-to-date with Pap smear with [facility]; next one is due 2014.  5. Vaginal dryness:  Due to tamoxifen.  I advised her to try Astroglide or other non-estrogen containing lubricants.  If these are not effective, then she may try estrogen-containing lubricants later on.  6. Follow up in about 6 months.      The length of time of the face-to-face encounter was 15 minutes. More than 50% of time was spent counseling and coordination of care.

## 2011-10-18 ENCOUNTER — Encounter (INDEPENDENT_AMBULATORY_CARE_PROVIDER_SITE_OTHER): Payer: Self-pay | Admitting: General Surgery

## 2011-10-23 ENCOUNTER — Other Ambulatory Visit: Payer: Self-pay | Admitting: *Deleted

## 2011-10-23 MED ORDER — TAMOXIFEN CITRATE 20 MG PO TABS: 20.0000 mg | ORAL_TABLET | Freq: Every day | ORAL | Status: DC

## 2012-03-19 NOTE — Patient Instructions (Addendum)
1.  DIAGNOSIS: History of multifocal intermediate grade ductal carcinoma in situ, measured total of 0.3 cm on lumpectomy dated 12/10/2007. There was no evidence of invasive carcinoma. Margins were negative. ER 100%, PR 36%.   2.  PRIOR THERAPY: Status post lumpectomy, status post adjuvant radiation therapy.   3.  CURRENT THERAPY: Tamoxifen 20 mg p.o. daily started on 03/05/2008.  Goal to continue until September 2014.  Follow up:  Yearly mammogram.  Next MD visit in about 8 months.

## 2012-03-20 ENCOUNTER — Telehealth: Payer: Self-pay | Admitting: Oncology

## 2012-03-20 ENCOUNTER — Ambulatory Visit (HOSPITAL_BASED_OUTPATIENT_CLINIC_OR_DEPARTMENT_OTHER): Payer: Federal, State, Local not specified - PPO | Admitting: Oncology

## 2012-03-20 ENCOUNTER — Other Ambulatory Visit (HOSPITAL_BASED_OUTPATIENT_CLINIC_OR_DEPARTMENT_OTHER): Payer: Federal, State, Local not specified - PPO | Admitting: Lab

## 2012-03-20 VITALS — BP 130/85 | HR 69 | Temp 97.3°F | Resp 20 | Ht 66.5 in | Wt 232.2 lb

## 2012-03-20 DIAGNOSIS — D0510 Intraductal carcinoma in situ of unspecified breast: Secondary | ICD-10-CM

## 2012-03-20 DIAGNOSIS — D0590 Unspecified type of carcinoma in situ of unspecified breast: Secondary | ICD-10-CM

## 2012-03-20 LAB — CBC WITH DIFFERENTIAL/PLATELET
BASO%: 0.7 % (ref 0.0–2.0)
Eosinophils Absolute: 0.1 10*3/uL (ref 0.0–0.5)
HCT: 37.8 % (ref 34.8–46.6)
MCHC: 33.8 g/dL (ref 31.5–36.0)
MONO#: 0.4 10*3/uL (ref 0.1–0.9)
NEUT#: 2.1 10*3/uL (ref 1.5–6.5)
NEUT%: 47.8 % (ref 38.4–76.8)
WBC: 4.3 10*3/uL (ref 3.9–10.3)
lymph#: 1.7 10*3/uL (ref 0.9–3.3)

## 2012-03-20 LAB — COMPREHENSIVE METABOLIC PANEL (CC13)
ALT: 12 U/L (ref 0–55)
CO2: 29 mEq/L (ref 22–29)
Calcium: 9.5 mg/dL (ref 8.4–10.4)
Chloride: 107 mEq/L (ref 98–107)
Creatinine: 0.8 mg/dL (ref 0.6–1.1)
Sodium: 141 mEq/L (ref 136–145)
Total Protein: 6.5 g/dL (ref 6.4–8.3)

## 2012-03-20 NOTE — Telephone Encounter (Signed)
gv and printed appt schedule for July 2014

## 2012-03-21 NOTE — Progress Notes (Signed)
[facility]  Telephone:([phone] Fax:(336) [phone]   OFFICE PROGRESS NOTE   Cc:  Bonnie Hoit, MD  DIAGNOSIS: History of multifocal intermediate grade ductal carcinoma in situ, measured total of 0.3 cm on lumpectomy dated 12/10/2007. There was no evidence of invasive carcinoma. Margins were negative. ER 100%, PR 36%.   PRIOR THERAPY: Status post lumpectomy, status post adjuvant radiation therapy.   CURRENT THERAPY: Tamoxifen 20 mg p.o. daily started on 03/05/18.  INTERVAL HISTORY: Bonnie Collins 61 y.o. female returns for regular follow up by herself.  She reports feeling well.  She denied leg/calf swelling/pain, chest pain, SOB, DOE, vaginal bleeding, breast abnormalities. She is keeping herself busy of pottery.   The rest of the 14 point review of system was negative.   Past Medical History  Diagnosis Date  . Carpal tunnel syndrome   . Sinus problem   . DCIS (ductal carcinoma in situ) 2009    s/p lumpectomy; s/p adj XRT; on adj Tamoxifen    Past Surgical History  Procedure Date  . Carpal tunnel release 1993 & 2003  . Breast lumpectomy 2009    Current Outpatient Prescriptions  Medication Sig Dispense Refill  . Calcium-Vitamin D-Vitamin K 500-500-40 MG-UNT-MCG CHEW Chew 1 tablet by mouth 2 (two) times daily.       . Cetirizine HCl (ZYRTEC PO) Take by mouth daily as needed.       Marland Kitchen co-enzyme Q-10 30 MG capsule Take 30 mg by mouth daily.      . Multiple Vitamin (MULTIVITAMIN PO) Take by mouth. MULTIVITAMIN FOR WOMEN OVER 50       . NATURAL PSYLLIUM FIBER PO Take by mouth.        . Omega-3 Fatty Acids (FISH OIL) 1000 MG CAPS Take 1 capsule by mouth daily.       . tamoxifen (NOLVADEX) 20 MG tablet Take 1 tablet (20 mg total) by mouth daily.  90 tablet  3    ALLERGIES:   has no known allergies.  REVIEW OF SYSTEMS:  The rest of the 14-point review of system was negative.   Filed Vitals:   03/20/12 1328  BP: 130/85  Pulse: 69  Temp: 97.3 F  (36.3 C)  Resp: 20   Wt Readings from Last 3 Encounters:  03/20/12 232 lb 3.2 oz (105.325 kg)  09/20/11 227 lb 6.4 oz (103.148 kg)  03/23/11 226 lb 8 oz (102.74 kg)   ECOG Performance status: 0  PHYSICAL EXAMINATION:   General:  well-nourished woman, in no acute distress.  Eyes:  no scleral icterus.  ENT:  There were no oropharyngeal lesions.  Neck was without thyromegaly.  Lymphatics:  Negative cervical, supraclavicular or axillary adenopathy.  Respiratory: lungs were clear bilaterally without wheezing or crackles.  Cardiovascular:  Regular rate and rhythm, S1/S2, without murmur, rub or gallop.  There was no pedal edema.  GI:  abdomen was soft, flat, nontender, nondistended, without organomegaly.  Muscoloskeletal:  no spinal tenderness of palpation of vertebral spine.  Skin exam was without echymosis, petichae.  Neuro exam was nonfocal.  Patient was able to get on and off exam table without assistance.  Gait was normal.  Patient was alerted and oriented.  Attention was good.   Language was appropriate.  Mood was normal without depression.  Speech was not pressured.  Thought content was not tangential.  Bilateral breast exam was negative for mass, skin thickening, erythema, niple discharge, nipple inversion.     LABORATORY/RADIOLOGY DATA:  Lab  Results  Component Value Date   WBC 4.3 03/20/2012   HGB 12.8 03/20/2012   HCT 37.8 03/20/2012   PLT 185 03/20/2012   GLUCOSE 92 03/20/2012   ALKPHOS 54 03/20/2012   ALT 12 03/20/2012   AST 17 03/20/2012   NA 141 03/20/2012   K 4.5 03/20/2012   CL 107 03/20/2012   CREATININE 0.8 03/20/2012   BUN 17.0 03/20/2012   CO2 29 03/20/2012    ASSESSMENT AND PLAN:   1. History of ductal carcinoma in situ. Bonnie Collins is doing well on adjuvant tamoxifen. She has no evidence of disease recurrence or metastatic disease.  I recommended continuing the tamoxifen 20 mg p.o. daily for 5 years total that is due to finish in October 2014.  2. Surveillance.  Her last mammogram on 11/01/2011 with [facility] was negative.  Next one is due in 10/2012.  3. Neutropenia:  Resolved.  Unclear etiology from previous visits.  She did not have recurrent infections.  4. Age-appropriate cancer screening: Colonoscopy is due in 2014. She is up-to-date with Pap smear with [facility]; next one is due 2014.  5. Follow up in about 8 months.       The length of time of the face-to-face encounter was 15 minutes. More than 50% of time was spent counseling and coordination of care.

## 2012-10-24 ENCOUNTER — Other Ambulatory Visit: Payer: Self-pay

## 2012-10-24 MED ORDER — TAMOXIFEN CITRATE 20 MG PO TABS: 20.0000 mg | ORAL_TABLET | Freq: Every day | ORAL | Status: DC

## 2012-11-18 ENCOUNTER — Other Ambulatory Visit: Payer: Federal, State, Local not specified - PPO | Admitting: Lab

## 2012-11-18 ENCOUNTER — Ambulatory Visit: Payer: Federal, State, Local not specified - PPO | Admitting: Oncology

## 2012-11-20 ENCOUNTER — Other Ambulatory Visit (HOSPITAL_BASED_OUTPATIENT_CLINIC_OR_DEPARTMENT_OTHER): Payer: Federal, State, Local not specified - PPO

## 2012-11-20 ENCOUNTER — Telehealth: Payer: Self-pay | Admitting: Oncology

## 2012-11-20 ENCOUNTER — Encounter: Payer: Self-pay | Admitting: Oncology

## 2012-11-20 ENCOUNTER — Ambulatory Visit (HOSPITAL_BASED_OUTPATIENT_CLINIC_OR_DEPARTMENT_OTHER): Payer: Federal, State, Local not specified - PPO | Admitting: Oncology

## 2012-11-20 VITALS — BP 134/79 | HR 78 | Temp 97.3°F | Resp 18 | Ht 66.5 in | Wt 233.8 lb

## 2012-11-20 DIAGNOSIS — D0510 Intraductal carcinoma in situ of unspecified breast: Secondary | ICD-10-CM

## 2012-11-20 DIAGNOSIS — D0590 Unspecified type of carcinoma in situ of unspecified breast: Secondary | ICD-10-CM

## 2012-11-20 LAB — COMPREHENSIVE METABOLIC PANEL (CC13)
ALT: 15 U/L (ref 0–55)
CO2: 29 mEq/L (ref 22–29)
Calcium: 9.1 mg/dL (ref 8.4–10.4)
Chloride: 105 mEq/L (ref 98–109)
Sodium: 141 mEq/L (ref 136–145)
Total Bilirubin: 0.31 mg/dL (ref 0.20–1.20)
Total Protein: 6.9 g/dL (ref 6.4–8.3)

## 2012-11-20 LAB — CBC WITH DIFFERENTIAL/PLATELET
BASO%: 0.9 % (ref 0.0–2.0)
MCHC: 33.5 g/dL (ref 31.5–36.0)
MONO#: 0.4 10*3/uL (ref 0.1–0.9)
RBC: 4.64 10*6/uL (ref 3.70–5.45)
WBC: 4.3 10*3/uL (ref 3.9–10.3)
lymph#: 1.4 10*3/uL (ref 0.9–3.3)

## 2012-11-20 NOTE — Telephone Encounter (Signed)
Gave pt appt for lab and MD on july 2015 and mammogram on 11/06/12 @ [facility]

## 2012-11-20 NOTE — Progress Notes (Signed)
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 There was no evidence of invasive carcinoma. Margins were negative. ER 100%, PR 36%.   PRIOR THERAPY: Status post lumpectomy, status post adjuvant radiation therapy.   CURRENT THERAPY: Tamoxifen 20 mg p.o. daily started on 03/05/08.  INTERVAL HISTORY: Bonnie Collins 62 y.o. female returns for regular follow up by herself.  She reports feeling well.  She denied leg/calf swelling/pain, chest pain, SOB, DOE, vaginal bleeding, breast abnormalities. She is keeping herself busy of pottery.   The rest of the 14 point review of system was negative.   Past Medical History  Diagnosis Date  . Carpal tunnel syndrome   . Sinus problem   . DCIS (ductal carcinoma in situ) 2009    s/p lumpectomy; s/p adj XRT; on adj Tamoxifen    Past Surgical History  Procedure Laterality Date  . Carpal tunnel release  1993 & 2003  . Breast lumpectomy  2009    Current Outpatient Prescriptions  Medication Sig Dispense Refill  . Calcium Carbonate (CALTRATE 600 PO) Take 1 tablet by mouth daily.      . Cetirizine HCl (ZYRTEC PO) Take by mouth daily as needed.       Marland Kitchen co-enzyme Q-10 30 MG capsule Take 30 mg by mouth daily.      . Multiple Vitamin (MULTIVITAMIN PO) Take by mouth. MULTIVITAMIN FOR WOMEN OVER 50       . NATURAL PSYLLIUM FIBER PO Take by mouth.        . Omega-3 Fatty Acids (FISH OIL) 1000 MG CAPS Take 1 capsule by mouth daily.       . tamoxifen (NOLVADEX) 20 MG tablet Take 1 tablet (20 mg total) by mouth daily.  90 tablet  3   No current facility-administered medications for this visit.    ALLERGIES:  has No Known Allergies.  REVIEW OF SYSTEMS:  The rest of the 14-point review of system was negative.   Filed Vitals:   11/20/12  1311  BP: 134/79  Pulse: 78  Temp: 97.3 F (36.3 C)  Resp: 18   Wt Readings from Last 3 Encounters:  11/20/12 233 lb 12.8 oz (106.051 kg)  03/20/12 232 lb 3.2 oz (105.325 kg)  09/20/11 227 lb 6.4 oz (103.148 kg)   ECOG Performance status: 0  PHYSICAL EXAMINATION:   General:  well-nourished woman, in no acute distress.  Eyes:  no scleral icterus.  ENT:  There were no oropharyngeal lesions.  Neck was without thyromegaly.  Lymphatics:  Negative cervical, supraclavicular or axillary adenopathy.  Respiratory: lungs were clear bilaterally without wheezing or crackles.  Cardiovascular:  Regular rate and rhythm, S1/S2, without murmur, rub or gallop.  There was no pedal edema.  GI:  abdomen was soft, flat, nontender, nondistended, without organomegaly.  Muscoloskeletal:  no spinal tenderness of palpation of vertebral spine.  Skin exam was without echymosis, petichae.  Neuro exam was nonfocal.  Patient was able to get on and off exam table without assistance.  Gait was normal.  Patient was alerted and oriented.  Attention was good.   Language was appropriate.  Mood was normal without depression.  Speech was not pressured.  Thought content was not tangential.  Bilateral breast exam was negative for mass, skin thickening, erythema, niple discharge, nipple inversion.  LABORATORY/RADIOLOGY DATA:  Lab Results  Component Value Date   WBC 4.3 11/20/2012   HGB 13.6 11/20/2012   HCT 40.6 11/20/2012   PLT 189 11/20/2012   GLUCOSE 77 11/20/2012   ALKPHOS 59 11/20/2012   ALT 15 11/20/2012   AST 18 11/20/2012   NA 141 11/20/2012   K 4.0 11/20/2012   CL 107 03/20/2012   CREATININE 0.9 11/20/2012   BUN 15.3 11/20/2012   CO2 29 11/20/2012    ASSESSMENT AND PLAN:   1. History of ductal carcinoma in situ. Bonnie Collins is doing well on adjuvant tamoxifen. She has no evidence of disease recurrence or metastatic disease.  She will complete 5 year of tamoxifen 20 mg p.o. daily in October 2014. Discussed that there  is no data to extend Tamoxifen use with DCIS. She is in agreement with stopping in October 2014.  2. Surveillance. Her last mammogram on 11/05/2012 with [facility] was negative.  Next one is due in 11/2013.  3. Neutropenia:  Resolved.  Unclear etiology from previous visits.  She did not have recurrent infections.  4. Age-appropriate cancer screening: Colonoscopy is due in 2014. She is up-to-date with Pap smear with [facility].  5. I have given her the option of being followed by [place] annually versus being followed by her PCP. She prefers to follow with [place] annually. Follow up in about 1 year.       The length of time of the face-to-face encounter was 15 minutes. More than 50% of time was spent counseling and coordination of care.

## 2012-12-30 ENCOUNTER — Ambulatory Visit: Payer: Self-pay | Admitting: Nurse Practitioner

## 2013-01-02 ENCOUNTER — Encounter: Payer: Self-pay | Admitting: Nurse Practitioner

## 2013-01-02 ENCOUNTER — Ambulatory Visit (INDEPENDENT_AMBULATORY_CARE_PROVIDER_SITE_OTHER): Payer: Federal, State, Local not specified - PPO | Admitting: Nurse Practitioner

## 2013-01-02 VITALS — BP 120/76 | HR 72 | Resp 16 | Ht 65.5 in | Wt 228.0 lb

## 2013-01-02 DIAGNOSIS — Z0000 Encounter for general adult medical examination without abnormal findings: Secondary | ICD-10-CM

## 2013-01-02 DIAGNOSIS — Z01419 Encounter for gynecological examination (general) (routine) without abnormal findings: Secondary | ICD-10-CM

## 2013-01-02 LAB — POCT URINALYSIS DIPSTICK
Bilirubin, UA: NEGATIVE
Nitrite, UA: NEGATIVE
pH, UA: 7

## 2013-01-02 NOTE — Progress Notes (Signed)
Patient ID: Bonnie Collins, female   DOB: 1951-01-23, 62 y.o.   MRN: [id number] 62 y.o. G0P0 Single Caucasian Fe here for annual exam.  Will schedule colonoscopy this fall. Not dating or sexually active.  Slight vaso symptoms. Still seeing oncologist and is scheduled to come off Tamoxifen this October. Mother who lives in [place] is still doing well.  No LMP recorded. Patient is postmenopausal.          Sexually active: no  The current method of family planning is abstinence.    Exercising: yes  Home exercise routine includes walking twice per day. Smoker:  no  Health Maintenance: Pap:  12/28/11, WNL, neg HR HPV MMG:  11/05/12, BI-Rad 2: benign findings Colonoscopy:  2004, repeat in 10 years BMD:   11/01/11, normal TDaP:  8/08 Labs: HB: 13.3 Urine: trace blood, pH 7.0    reports that she has never smoked. She does not have any smokeless tobacco history on file. She reports that  drinks alcohol.  Past Medical History  Diagnosis Date  . Carpal tunnel syndrome   . Sinus problem   . DCIS (ductal carcinoma in situ) 2009    s/p lumpectomy; s/p adj XRT; on adj Tamoxifen    Past Surgical History  Procedure Laterality Date  . Carpal tunnel release  1993 & 2003  . Breast lumpectomy  2009    Current Outpatient Prescriptions  Medication Sig Dispense Refill  . Calcium Carbonate (CALTRATE 600 PO) Take 1 tablet by mouth daily.      . Cetirizine HCl (ZYRTEC PO) Take by mouth daily as needed.       Marland Kitchen co-enzyme Q-10 30 MG capsule Take 30 mg by mouth daily.      . Multiple Vitamin (MULTIVITAMIN PO) Take by mouth. MULTIVITAMIN FOR WOMEN OVER 50       . NATURAL PSYLLIUM FIBER PO Take by mouth.        . Omega-3 Fatty Acids (FISH OIL) 1000 MG CAPS Take 1 capsule by mouth daily.       . tamoxifen (NOLVADEX) 20 MG tablet Take 1 tablet (20 mg total) by mouth daily.  90 tablet  3   No current facility-administered medications for this visit.    Family History  Problem Relation Age of Onset  .  Diabetes Mother   . Heart disease Father     ROS:  Pertinent items are noted in HPI.  Otherwise, a comprehensive ROS was negative.  Exam:   BP 120/76  Pulse 72  Resp 16  Ht 5' 5.5" (1.664 m)  Wt 228 lb (103.42 kg)  BMI 37.35 kg/m2 Height: 5' 5.5" (166.4 cm)  Ht Readings from Last 3 Encounters:  01/02/13 5' 5.5" (1.664 m)  11/20/12 5' 6.5" (1.689 m)  03/20/12 5' 6.5" (1.689 m)    General appearance: alert, cooperative and appears stated age Head: Normocephalic, without obvious abnormality, atraumatic Neck: no adenopathy, supple, symmetrical, trachea midline and thyroid normal to inspection and palpation Lungs: clear to auscultation bilaterally Breasts: Right  normal appearance, no masses or tenderness, left breast with surgical and radiation changes Heart: regular rate and rhythm Abdomen: soft, non-tender; no masses,  no organomegaly Extremities: extremities normal, atraumatic, no cyanosis or edema Skin: Skin color, texture, turgor normal. No rashes or lesions Lymph nodes: Cervical, supraclavicular, and axillary nodes normal. No abnormal inguinal nodes palpated Neurologic: Grossly normal   Pelvic: External genitalia:  no lesions  Urethra:  normal appearing urethra with no masses, tenderness or lesions              Bartholin's and Skene's: normal                 Vagina: normal appearing vagina with normal color and discharge, no lesions              Cervix: anteverted              Pap taken: no Bimanual Exam:  Uterus:  normal size, contour, position, consistency, mobility, non-tender              Adnexa: no mass, fullness, tenderness               Rectovaginal: Confirms               Anus:  normal sphincter tone, no lesions  A:  Well Woman with normal exam  Postmenopausal  S/P left Breast Cancer DCIS 12/10/07 with radiation and lumpectomy  Tamoxifen for 5 years due to end in October 2014  P:   Pap smear as per guidelines   Mammogram due 7/15  Counseled on  breast self exam, adequate intake of calcium and vitamin D, diet and exercise return annually or prn  An After Visit Summary was printed and given to the patient.

## 2013-01-02 NOTE — Patient Instructions (Signed)

## 2013-01-03 LAB — HEMOGLOBIN, FINGERSTICK: Hemoglobin, fingerstick: 13.3 g/dL (ref 12.0–16.0)

## 2013-01-03 NOTE — Progress Notes (Signed)
Encounter reviewed by Dr. Brook Silva.  

## 2013-02-13 ENCOUNTER — Encounter: Payer: Self-pay | Admitting: Internal Medicine

## 2013-03-18 ENCOUNTER — Ambulatory Visit (AMBULATORY_SURGERY_CENTER): Payer: Self-pay | Admitting: *Deleted

## 2013-03-18 VITALS — Ht 66.0 in | Wt 232.0 lb

## 2013-03-18 DIAGNOSIS — Z1211 Encounter for screening for malignant neoplasm of colon: Secondary | ICD-10-CM

## 2013-03-18 MED ORDER — NA SULFATE-K SULFATE-MG SULF 17.5-3.13-1.6 GM/177ML PO SOLN: 1.0000 | Freq: Once | ORAL | Status: DC

## 2013-03-18 NOTE — Progress Notes (Signed)
No allergies to eggs or soy. No problems with anesthesia.  

## 2013-03-25 ENCOUNTER — Encounter: Payer: Self-pay | Admitting: Internal Medicine

## 2013-04-07 ENCOUNTER — Encounter: Payer: Federal, State, Local not specified - PPO | Admitting: Internal Medicine

## 2013-04-10 ENCOUNTER — Ambulatory Visit (AMBULATORY_SURGERY_CENTER): Payer: Federal, State, Local not specified - PPO | Admitting: Internal Medicine

## 2013-04-10 ENCOUNTER — Encounter: Payer: Self-pay | Admitting: Internal Medicine

## 2013-04-10 VITALS — BP 124/73 | HR 71 | Temp 97.9°F | Resp 21 | Ht 66.0 in | Wt 232.0 lb

## 2013-04-10 DIAGNOSIS — K5730 Diverticulosis of large intestine without perforation or abscess without bleeding: Secondary | ICD-10-CM

## 2013-04-10 DIAGNOSIS — Z1211 Encounter for screening for malignant neoplasm of colon: Secondary | ICD-10-CM

## 2013-04-10 MED ORDER — SODIUM CHLORIDE 0.9 % IV SOLN: 500.0000 mL | INTRAVENOUS | Status: DC

## 2013-04-10 NOTE — Patient Instructions (Addendum)
You have mild diverticulosis but no other abnormalities and no polyps. Diverticulosis - common and not usually a problem. Please read the handout provided.  Next routine colonoscopy in 10 years - 2024.   I appreciate the opportunity to care for you. Iva Boop, MD, FACG  YOU HAD AN ENDOSCOPIC PROCEDURE TODAY AT THE [facility]: Refer to the procedure report that was given to you for any specific questions about what was found during the examination.  If the procedure report does not answer your questions, please call your gastroenterologist to clarify.  If you requested that your care partner not be given the details of your procedure findings, then the procedure report has been included in a sealed envelope for you to review at your convenience later.  YOU SHOULD EXPECT: Some feelings of bloating in the abdomen. Passage of more gas than usual.  Walking can help get rid of the air that was put into your GI tract during the procedure and reduce the bloating. If you had a lower endoscopy (such as a colonoscopy or flexible sigmoidoscopy) you may notice spotting of blood in your stool or on the toilet paper. If you underwent a bowel prep for your procedure, then you may not have a normal bowel movement for a few days.  DIET: Your first meal following the procedure should be a light meal and then it is ok to progress to your normal diet.  A half-sandwich or bowl of soup is an example of a good first meal.  Heavy or fried foods are harder to digest and may make you feel nauseous or bloated.  Likewise meals heavy in dairy and vegetables can cause extra gas to form and this can also increase the bloating.  Drink plenty of fluids but you should avoid alcoholic beverages for 24 hours.  ACTIVITY: Your care partner should take you home directly after the procedure.  You should plan to take it easy, moving slowly for the rest of the day.  You can resume normal activity the day after the procedure  however you should NOT DRIVE or use heavy machinery for 24 hours (because of the sedation medicines used during the test).    SYMPTOMS TO REPORT IMMEDIATELY: A gastroenterologist can be reached at any hour.  During normal business hours, 8:30 AM to 5:00 PM Monday through Friday, call [phone].  After hours and on weekends, please call the GI answering service at [phone] who will take a message and have the physician on call contact you.   Following lower endoscopy (colonoscopy or flexible sigmoidoscopy):  Excessive amounts of blood in the stool  Significant tenderness or worsening of abdominal pains  Swelling of the abdomen that is new, acute  Fever of 100F or higher   FOLLOW UP: If any biopsies were taken you will be contacted by phone or by letter within the next 1-3 weeks.  Call your gastroenterologist if you have not heard about the biopsies in 3 weeks.  Our staff will call the home number listed on your records the next business day following your procedure to check on you and address any questions or concerns that you may have at that time regarding the information given to you following your procedure. This is a courtesy call and so if there is no answer at the home number and we have not heard from you through the emergency physician on call, we will assume that you have returned to your regular daily activities without incident.  SIGNATURES/CONFIDENTIALITY: You and/or your care partner have signed paperwork which will be entered into your electronic medical record.  These signatures attest to the fact that that the information above on your After Visit Summary has been reviewed and is understood.  Full responsibility of the confidentiality of this discharge information lies with you and/or your care-partner.  Diverticulosis-handout given  Repeat colonoscopy in 10 years-2024.

## 2013-04-10 NOTE — Progress Notes (Signed)
Lidocaine-40mg IV prior to Propofol InductionPropofol given over incremental dosages 

## 2013-04-10 NOTE — Op Note (Signed)
[facility] [address] [place], [address]   COLONOSCOPY PROCEDURE REPORT  PATIENT: Bonnie, Collins  MR#: [id number] BIRTHDATE: 11-Nov-1950 , 62  yrs. old GENDER: Female ENDOSCOPIST: Iva Boop, MD, [facility] PROCEDURE DATE:  04/10/2013 PROCEDURE:   Colonoscopy, screening First Screening Colonoscopy - Avg.  risk and is 50 yrs.  old or older - No.  Prior Negative Screening - Now for repeat screening. 10 or more years since last screening  History of Adenoma - Now for follow-up colonoscopy & has been > or = to 3 yrs.  N/A  Polyps Removed Today? No.  Recommend repeat exam, <10 yrs? No. ASA CLASS:   Class II INDICATIONS:average risk screening and Last colonoscopy performed 10 years ago. MEDICATIONS: propofol (Diprivan) 200mg  IV, MAC sedation, administered by CRNA, and These medications were titrated to patient response per physician's verbal order  DESCRIPTION OF PROCEDURE:   After the risks benefits and alternatives of the procedure were thoroughly explained, informed consent was obtained.  A digital rectal exam revealed no abnormalities of the rectum.   The LB [id number]  endoscope was introduced through the anus and advanced to the cecum, which was identified by both the appendix and ileocecal valve. No adverse events experienced.   The quality of the prep was excellent using Suprep  The instrument was then slowly withdrawn as the colon was fully examined.      COLON FINDINGS: Mild diverticulosis was noted in the sigmoid colon. The colon mucosa was otherwise normal.   A right colon retroflexion was performed.  Retroflexed views revealed no abnormalities. The time to cecum=4 minutes 38 seconds.  Withdrawal time=6 minutes 08 seconds.  The scope was withdrawn and the procedure completed. COMPLICATIONS: There were no complications.  ENDOSCOPIC IMPRESSION: 1.   Mild diverticulosis was noted in the sigmoid colon 2.   The colon mucosa was otherwise  normal  RECOMMENDATIONS: Repeat routine colonoscopy in 10 years 2024.   eSigned:  Iva Boop, MD, [facility] 04/10/2013 2:01 PM   cc: Benedetto Goad, MD and The Patient

## 2013-04-10 NOTE — Progress Notes (Signed)
Patient did not experience any of the following events: a burn prior to discharge; a fall within the facility; wrong site/side/patient/procedure/implant event; or a hospital transfer or hospital admission upon discharge from the facility. (G8907) Patient did not have preoperative order for IV antibiotic SSI prophylaxis. (G8918)  

## 2013-04-11 ENCOUNTER — Telehealth: Payer: Self-pay

## 2013-04-11 NOTE — Telephone Encounter (Signed)
  Follow up Call-  Call back number 04/10/2013  Post procedure Call Back phone  # [phone]  Permission to leave phone message Yes     Patient questions:  Do you have a fever, pain , or abdominal swelling? no Pain Score  0 *  Have you tolerated food without any problems? yes  Have you been able to return to your normal activities? yes  Do you have any questions about your discharge instructions: Diet   no Medications  no Follow up visit  no  Do you have questions or concerns about your Care? no  Actions: * If pain score is 4 or above: No action needed, pain <4.  Per the pt "no problems, I am fine". Maw

## 2013-11-20 ENCOUNTER — Encounter: Payer: Self-pay | Admitting: Hematology and Oncology

## 2013-11-20 ENCOUNTER — Other Ambulatory Visit (HOSPITAL_BASED_OUTPATIENT_CLINIC_OR_DEPARTMENT_OTHER): Payer: Federal, State, Local not specified - PPO

## 2013-11-20 ENCOUNTER — Ambulatory Visit (HOSPITAL_BASED_OUTPATIENT_CLINIC_OR_DEPARTMENT_OTHER): Payer: Federal, State, Local not specified - PPO | Admitting: Hematology and Oncology

## 2013-11-20 VITALS — BP 134/69 | HR 89 | Temp 98.1°F | Resp 18 | Ht 66.0 in | Wt 234.0 lb

## 2013-11-20 DIAGNOSIS — Z87898 Personal history of other specified conditions: Secondary | ICD-10-CM

## 2013-11-20 DIAGNOSIS — D0512 Intraductal carcinoma in situ of left breast: Secondary | ICD-10-CM

## 2013-11-20 DIAGNOSIS — D0510 Intraductal carcinoma in situ of unspecified breast: Secondary | ICD-10-CM

## 2013-11-20 LAB — COMPREHENSIVE METABOLIC PANEL (CC13)
ALBUMIN: 3.6 g/dL (ref 3.5–5.0)
ALT: 13 U/L (ref 0–55)
AST: 16 U/L (ref 5–34)
Alkaline Phosphatase: 72 U/L (ref 40–150)
Anion Gap: 9 mEq/L (ref 3–11)
BUN: 13.3 mg/dL (ref 7.0–26.0)
CHLORIDE: 108 meq/L (ref 98–109)
CO2: 28 mEq/L (ref 22–29)
Calcium: 9.6 mg/dL (ref 8.4–10.4)
Creatinine: 0.9 mg/dL (ref 0.6–1.1)
GLUCOSE: 141 mg/dL — AB (ref 70–140)
POTASSIUM: 4 meq/L (ref 3.5–5.1)
SODIUM: 144 meq/L (ref 136–145)
TOTAL PROTEIN: 6.8 g/dL (ref 6.4–8.3)
Total Bilirubin: 0.35 mg/dL (ref 0.20–1.20)

## 2013-11-20 LAB — CBC WITH DIFFERENTIAL/PLATELET
BASO%: 0.8 % (ref 0.0–2.0)
Basophils Absolute: 0 10*3/uL (ref 0.0–0.1)
EOS ABS: 0.1 10*3/uL (ref 0.0–0.5)
EOS%: 3.2 % (ref 0.0–7.0)
HCT: 42.2 % (ref 34.8–46.6)
HGB: 13.6 g/dL (ref 11.6–15.9)
LYMPH#: 1.3 10*3/uL (ref 0.9–3.3)
LYMPH%: 29.7 % (ref 14.0–49.7)
MCH: 28.4 pg (ref 25.1–34.0)
MCHC: 32.3 g/dL (ref 31.5–36.0)
MCV: 87.9 fL (ref 79.5–101.0)
MONO#: 0.3 10*3/uL (ref 0.1–0.9)
MONO%: 7.5 % (ref 0.0–14.0)
NEUT#: 2.5 10*3/uL (ref 1.5–6.5)
NEUT%: 58.8 % (ref 38.4–76.8)
Platelets: 201 10*3/uL (ref 145–400)
RBC: 4.81 10*6/uL (ref 3.70–5.45)
RDW: 14.6 % — AB (ref 11.2–14.5)
WBC: 4.3 10*3/uL (ref 3.9–10.3)

## 2013-11-20 NOTE — Progress Notes (Signed)
[place] progress notes  Patient Care Team: Christain Sacramento, MD as PCP - General (Family Medicine) Heath Lark, MD as Consulting Physician (Hematology and Oncology)  CHIEF COMPLAINTS/PURPOSE OF VISIT:  Left breast DCIS  HISTORY OF PRESENTING ILLNESS:  Bonnie Collins 63 y.o. female was transferred to my care after her prior physician has left.  I reviewed the patient's records extensive and collaborated the history with the patient. Summary of her history is as follows: This patient was discovered to have breast cancer after presented with abnormal mammogram. She underwent lumpectomy in August 2009, pathology showed strong ER/PR positivity. She received adjuvant radiation therapy followed by tamoxifen from October 2009 to October 2014. She denies any recent abnormal breast examination, palpable mass, abnormal breast appearance or nipple changes MEDICAL HISTORY:  Past Medical History  Diagnosis Date  . Carpal tunnel syndrome   . Sinus problem   . DCIS (ductal carcinoma in situ) 2009    left breast; s/p lumpectomy; s/p adj XRT; on adj Tamoxifen    SURGICAL HISTORY: Past Surgical History  Procedure Laterality Date  . Carpal tunnel release Bilateral 1993 & 2003  . Breast lumpectomy Left 2009  . Tonsillectomy  age 63     SOCIAL HISTORY: History   Social History  . Marital Status: Single    Spouse Name: N/A    Number of Children: 0  . Years of Education: N/A   Occupational History  . Not on file.   Social History Main Topics  . Smoking status: Never Smoker   . Smokeless tobacco: Never Used  . Alcohol Use: Yes     Comment: OCCASIONALLY  . Drug Use: No  . Sexual Activity: No   Other Topics Concern  . Not on file   Social History Narrative  . No narrative on file    FAMILY HISTORY: Family History  Problem Relation Age of Onset  . Diabetes Mother   . Heart disease Mother   . Heart disease Father   . Heart failure Father   . Colon  cancer Neg Hx   . Cancer Paternal Aunt     lung ca    ALLERGIES:  has No Known Allergies.  MEDICATIONS:  Current Outpatient Prescriptions  Medication Sig Dispense Refill  . Calcium Carbonate (CALTRATE 600 PO) Take 1 tablet by mouth daily.      . Cetirizine HCl (ZYRTEC PO) Take by mouth daily as needed.       Marland Kitchen co-enzyme Q-10 30 MG capsule Take 30 mg by mouth daily.      . Multiple Vitamin (MULTIVITAMIN PO) Take by mouth. MULTIVITAMIN FOR WOMEN OVER 50       . NATURAL PSYLLIUM FIBER PO Take by mouth.        . Omega-3 Fatty Acids (FISH OIL) 1000 MG CAPS Take 1 capsule by mouth daily.        No current facility-administered medications for this visit.    REVIEW OF SYSTEMS:   Constitutional: Denies fevers, chills or abnormal night sweats Eyes: Denies blurriness of vision, double vision or watery eyes Ears, nose, mouth, throat, and face: Denies mucositis or sore throat Respiratory: Denies cough, dyspnea or wheezes Cardiovascular: Denies palpitation, chest discomfort or lower extremity swelling Gastrointestinal:  Denies nausea, heartburn or change in bowel habits Skin: Denies abnormal skin rashes Lymphatics: Denies new lymphadenopathy or easy bruising Neurological:Denies numbness, tingling or new weaknesses Behavioral/Psych: Mood is stable, no new changes  All other systems were reviewed with the  patient and are negative.  PHYSICAL EXAMINATION: ECOG PERFORMANCE STATUS: 0 - Asymptomatic  Filed Vitals:   11/20/13 1258  BP: 134/69  Pulse: 89  Temp: 98.1 F (36.7 C)  Resp: 18   Filed Weights   11/20/13 1258  Weight: 234 lb (106.142 kg)    GENERAL:alert, no distress and comfortable SKIN: skin color, texture, turgor are normal, no rashes or significant lesions EYES: normal, conjunctiva are pink and non-injected, sclera clear OROPHARYNX:no exudate, normal lips, buccal mucosa, and tongue  NECK: supple, thyroid normal size, non-tender, without nodularity LYMPH:  no palpable  lymphadenopathy in the cervical, axillary or inguinal LUNGS: clear to auscultation and percussion with normal breathing effort HEART: regular rate & rhythm and no murmurs without lower extremity edema ABDOMEN:abdomen soft, non-tender and normal bowel sounds Musculoskeletal:no cyanosis of digits and no clubbing  PSYCH: alert & oriented x 3 with fluent speech NEURO: no focal motor/sensory deficits Bilateral breast examination were performed. Well-healed lumpectomy scar on the left breast with no other abnormalities. LABORATORY DATA:  I have reviewed the data as listed Lab Results  Component Value Date   WBC 4.3 11/20/2013   HGB 13.6 11/20/2013   HCT 42.2 11/20/2013   MCV 87.9 11/20/2013   PLT 201 11/20/2013    Recent Labs  11/20/13 1251  NA 144  K 4.0  CO2 28  GLUCOSE 141*  BUN 13.3  CREATININE 0.9  CALCIUM 9.6  PROT 6.8  ALBUMIN 3.6  AST 16  ALT 13  ALKPHOS 72  BILITOT 0.35    ASSESSMENT & PLAN:  DCIS (ductal carcinoma in situ) The patient has completed all adjuvant treatment. Clinically, she has no evidence of disease. I will discharge her from the clinic and recommend she follows with primary care provider.   No orders of the defined types were placed in this encounter.    All questions were answered. The patient knows to call the clinic with any problems, questions or concerns. I spent 15 minutes counseling the patient face to face. The total time spent in the appointment was 20 minutes and more than 50% was on counseling.     [place], [place], MD 11/20/2013 9:00 PM

## 2013-11-20 NOTE — Assessment & Plan Note (Signed)
The patient has completed all adjuvant treatment. Clinically, she has no evidence of disease. I will discharge her from the clinic and recommend she follows with primary care provider.

## 2014-01-06 ENCOUNTER — Encounter: Payer: Self-pay | Admitting: Nurse Practitioner

## 2014-01-06 ENCOUNTER — Ambulatory Visit (INDEPENDENT_AMBULATORY_CARE_PROVIDER_SITE_OTHER): Payer: Federal, State, Local not specified - PPO | Admitting: Nurse Practitioner

## 2014-01-06 VITALS — BP 112/72 | HR 76 | Resp 18 | Ht 65.75 in | Wt 231.0 lb

## 2014-01-06 DIAGNOSIS — Z0000 Encounter for general adult medical examination without abnormal findings: Secondary | ICD-10-CM

## 2014-01-06 DIAGNOSIS — Z01419 Encounter for gynecological examination (general) (routine) without abnormal findings: Secondary | ICD-10-CM

## 2014-01-06 LAB — HEMOGLOBIN, FINGERSTICK: Hemoglobin, fingerstick: 14.4 g/dL (ref 12.0–16.0)

## 2014-01-06 NOTE — Progress Notes (Addendum)
63 y.o. G0P0 Single Caucasian Fe here for annual exam. She feels well without any new problems.  Not dating or SA.   Mother now 17 and fell this past year.  Now having to have someone to stay with her on and off when the daughters are not available. Mother is with her today visiting from [place]   Patient's last menstrual period was 04/08/2003.          Sexually active: No.  The current method of family planning is post menopausal status.    Exercising: Yes.    Walking 30 min daily Smoker:  no  Health Maintenance: Pap: 12/2011 Neg. HR HPV: Neg MMG: 11/2013 Normal  Colonoscopy: 04/2013 -mild diverticulitis, no polyps Repeat in 10 years  BMD:  11/2013 T Score:  2.6; right hip neck: -0.3; Left hip neck: -0.2; left forearm -0.004 TDaP:  2008 Labs: PCP   reports that she has never smoked. She has never used smokeless tobacco. She reports that she drinks alcohol. She reports that she does not use illicit drugs.  Past Medical History  Diagnosis Date  . Carpal tunnel syndrome   . Sinus problem   . 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 Carpal tunnel release Bilateral 1993 & 2003  . Breast lumpectomy Left 2009  . Tonsillectomy  age 21     Current Outpatient Prescriptions  Medication Sig Dispense Refill  . Calcium Carbonate (CALTRATE 600 PO) Take 1 tablet by mouth daily.      Marland Kitchen co-enzyme Q-10 30 MG capsule Take 30 mg by mouth daily.      . Multiple Vitamin (MULTIVITAMIN PO) Take by mouth. MULTIVITAMIN FOR WOMEN OVER 50       . NATURAL PSYLLIUM FIBER PO Take by mouth.        . Omega-3 Fatty Acids (FISH OIL) 1000 MG CAPS Take 1 capsule by mouth daily.        No current facility-administered medications for this visit.    Family History  Problem Relation Age of Onset  . Diabetes Mother   . Heart disease Mother   . Heart disease Father   . Heart failure Father   . Colon  cancer Neg Hx   . Cancer Paternal Aunt     lung ca    ROS:  Pertinent items are noted in HPI.  Otherwise, a comprehensive ROS was negative.  Exam:   BP 112/72  Pulse 76  Resp 18  Ht 5' 5.75" (1.67 m)  Wt 231 lb (104.781 kg)  BMI 37.57 kg/m2  LMP 04/08/2003 Height: 5' 5.75" (167 cm)  Ht Readings from Last 3 Encounters:  01/06/14 5' 5.75" (1.67 m)  11/20/13 5\' 6"  (1.676 m)  04/10/13 5\' 6"  (1.676 m)    General appearance: alert, cooperative and appears stated age Head: Normocephalic, without obvious abnormality, atraumatic Neck: no adenopathy, supple, symmetrical, trachea midline and thyroid normal to inspection and palpation Lungs: clear to auscultation bilaterally Breasts: normal appearance, no masses or tenderness, positive findings: left breast with surgical and radiation changes Heart: regular rate and rhythm Abdomen: soft, non-tender; no masses,  no organomegaly Extremities: extremities normal, atraumatic, no cyanosis or edema Skin: Skin color, texture, turgor normal. No rashes or lesions Lymph nodes: Cervical, supraclavicular, and axillary nodes normal. No abnormal inguinal nodes palpated Neurologic: Grossly normal   Pelvic: External genitalia:  no  lesions              Urethra:  normal appearing urethra with no masses, tenderness or lesions              Bartholin's and Skene's: normal                 Vagina: normal appearing vagina with normal color and discharge, no lesions              Cervix: anteverted              Pap taken: No. 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

## 2014-01-06 NOTE — Patient Instructions (Signed)

## 2014-01-12 NOTE — Progress Notes (Signed)
Encounter reviewed by Dr. Brook Silva.  

## 2014-02-17 ENCOUNTER — Emergency Department (HOSPITAL_COMMUNITY)
Admission: EM | Admit: 2014-02-17 | Discharge: 2014-02-17 | Disposition: A | Discharge status: Home / Self Care | Payer: Federal, State, Local not specified - PPO | Attending: Emergency Medicine | Admitting: Emergency Medicine

## 2014-02-17 ENCOUNTER — Encounter (HOSPITAL_COMMUNITY): Payer: Self-pay | Admitting: Emergency Medicine

## 2014-02-17 DIAGNOSIS — Y9389 Activity, other specified: Secondary | ICD-10-CM | POA: Diagnosis not present

## 2014-02-17 DIAGNOSIS — Z862 Personal history of diseases of the blood and blood-forming organs and certain disorders involving the immune mechanism: Secondary | ICD-10-CM | POA: Diagnosis not present

## 2014-02-17 DIAGNOSIS — Z79899 Other long term (current) drug therapy: Secondary | ICD-10-CM | POA: Insufficient documentation

## 2014-02-17 DIAGNOSIS — R519 Headache, unspecified: Secondary | ICD-10-CM

## 2014-02-17 DIAGNOSIS — Z8709 Personal history of other diseases of the respiratory system: Secondary | ICD-10-CM | POA: Diagnosis not present

## 2014-02-17 DIAGNOSIS — S0990XA Unspecified injury of head, initial encounter: Secondary | ICD-10-CM | POA: Insufficient documentation

## 2014-02-17 DIAGNOSIS — Y92481 Parking lot as the place of occurrence of the external cause: Secondary | ICD-10-CM | POA: Diagnosis not present

## 2014-02-17 DIAGNOSIS — Z8669 Personal history of other diseases of the nervous system and sense organs: Secondary | ICD-10-CM | POA: Diagnosis not present

## 2014-02-17 DIAGNOSIS — R51 Headache: Secondary | ICD-10-CM

## 2014-02-17 MED ORDER — NAPROXEN 500 MG PO TABS: 500.0000 mg | ORAL_TABLET | Freq: Two times a day (BID) | ORAL | Status: DC

## 2014-02-17 NOTE — ED Provider Notes (Signed)
Medical screening examination/treatment/procedure(s) were performed by non-physician practitioner and as supervising physician I was immediately available for consultation/collaboration.   EKG Interpretation None       Charlesetta Shanks, MD 02/17/14 [phone]
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
## 2014-11-03 ENCOUNTER — Telehealth: Payer: Self-pay | Admitting: Nurse Practitioner

## 2014-11-03 NOTE — Telephone Encounter (Signed)
Signed order faxed to [facility] For Diagnostic Bilateral Mammogram. Dx: Personal History of Breast Cancer.  Call to patient to advise order placed. Patient agreeable.  Routing to provider for final review. Patient agreeable to disposition. Will close encounter.

## 2014-11-03 NOTE — Telephone Encounter (Signed)
Patient says she need a referral for her mammogram sent to [facility]

## 2015-01-12 ENCOUNTER — Encounter: Payer: Self-pay | Admitting: Nurse Practitioner

## 2015-01-12 ENCOUNTER — Ambulatory Visit (INDEPENDENT_AMBULATORY_CARE_PROVIDER_SITE_OTHER): Payer: Federal, State, Local not specified - PPO | Admitting: Nurse Practitioner

## 2015-01-12 VITALS — BP 120/78 | HR 72 | Ht 66.0 in | Wt 243.0 lb

## 2015-01-12 DIAGNOSIS — N952 Postmenopausal atrophic vaginitis: Secondary | ICD-10-CM | POA: Diagnosis not present

## 2015-01-12 DIAGNOSIS — C50912 Malignant neoplasm of unspecified site of left female breast: Secondary | ICD-10-CM

## 2015-01-12 DIAGNOSIS — Z0000 Encounter for general adult medical examination without abnormal findings: Secondary | ICD-10-CM | POA: Diagnosis not present

## 2015-01-12 DIAGNOSIS — Z01419 Encounter for gynecological examination (general) (routine) without abnormal findings: Secondary | ICD-10-CM

## 2015-01-12 LAB — POCT URINALYSIS DIPSTICK
Bilirubin, UA: NEGATIVE
Glucose, UA: NEGATIVE
Ketones, UA: NEGATIVE
Leukocytes, UA: NEGATIVE
NITRITE UA: NEGATIVE
PROTEIN UA: NEGATIVE
UROBILINOGEN UA: NEGATIVE
pH, UA: 5.5

## 2015-01-12 LAB — HEMOGLOBIN, FINGERSTICK: Hemoglobin, fingerstick: 14.7 g/dL (ref 12.0–16.0)

## 2015-01-12 NOTE — Patient Instructions (Signed)

## 2015-01-12 NOTE — Progress Notes (Signed)
Patient ID: Bonnie Collins, female   DOB: [birth year], 64 y.o.   MRN: [id number] 64 y.o. G0P0 Single  Caucasian Fe here for annual exam.  No new diagnosis.  Not dating or SA. No new health problems.  She went to visit mother age 82 in [place] this past weekend.  Patient's last menstrual period was 04/08/2003 (approximate).          Sexually active: No.  The current method of family planning is abstinence and post menopausal status.    Exercising: Yes.    30 minute walk daily, sometimes two times daily Smoker:  no  Health Maintenance: Pap: 12/2011 Neg. HR HPV: Neg MMG: 11/11/14, 3D, Bi-Rads 1:  Negative   Colonoscopy: 04/2013 -mild diverticulitis, no polyps Repeat in 10 years  BMD: 11/06/2013 T Score: 2.6; right hip neck: -0.3; Left hip neck: -0.2; left forearm -0.004 TDaP: 2008 Shingles:  Not yet Labs:  HB:  14.7   Urine:  Trace RBC   reports that she has never smoked. She has never used smokeless tobacco. She reports that she drinks alcohol. She reports that she does not use illicit drugs.  Past Medical History  Diagnosis Date  . Carpal tunnel syndrome   . Sinus problem   . 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 Carpal tunnel release Bilateral 1993 & 2003  . Breast lumpectomy Left 2009  . Tonsillectomy  age 88     Current Outpatient Prescriptions  Medication Sig Dispense Refill  . Calcium Carbonate (CALTRATE 600 PO) Take 1 tablet by mouth daily.    Marland Kitchen co-enzyme Q-10 30 MG capsule Take 30 mg by mouth daily.    . Multiple Vitamin (MULTIVITAMIN PO) Take 1 capsule by mouth daily.     Marland Kitchen NATURAL PSYLLIUM FIBER PO Take 3 capsules by mouth 2 (two) times daily.     . Omega-3 Fatty Acids (FISH OIL) 1000 MG CAPS Take 1 capsule by mouth daily.      No current facility-administered medications for this visit.    Family History  Problem Relation Age of Onset  . Diabetes  Mother   . Heart disease Mother   . Heart disease Father   . Heart failure Father   . Colon cancer Neg Hx   . Cancer Paternal Aunt     lung ca    ROS:  Pertinent items are noted in HPI.  Otherwise, a comprehensive ROS was negative.  Exam:   BP 120/78 mmHg  Pulse 72  Ht 5\' 6"  (1.676 m)  Wt 243 lb (110.224 kg)  BMI 39.24 kg/m2  LMP 04/08/2003 (Approximate) Height: 5\' 6"  (167.6 cm) Ht Readings from Last 3 Encounters:  01/12/15 5\' 6"  (1.676 m)  01/06/14 5' 5.75" (1.67 m)  11/20/13 5\' 6"  (1.676 m)    General appearance: alert, cooperative and appears stated age Head: Normocephalic, without obvious abnormality, atraumatic Neck: no adenopathy, supple, symmetrical, trachea midline and thyroid normal to inspection and palpation Lungs: clear to auscultation bilaterally Breasts: normal appearance, no masses or tenderness, on the right with surgical and radiation changes on the left. Heart: regular rate and rhythm Abdomen: soft, non-tender; no masses,  no organomegaly Extremities: extremities normal, atraumatic, no cyanosis or edema Skin: Skin color, texture, turgor normal. No rashes or lesions Lymph nodes: Cervical, supraclavicular, and axillary nodes normal. No abnormal inguinal nodes palpated Neurologic: Grossly  normal   Pelvic: External genitalia:  no lesions              Urethra:  atrophic and prolapsed appearing urethra with no masses, tenderness or lesions              Bartholin's and Skene's: normal                 Vagina: normal appearing vagina with normal color and discharge, no lesions              Cervix: anteverted              Pap taken: Yes.   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  Counseled on breast self exam, mammography screening, adequate intake of calcium and vitamin D, diet and exercise return annually or prn  An After Visit Summary was printed and given to the patient.

## 2015-01-14 LAB — IPS PAP TEST WITH HPV

## 2015-01-14 NOTE — Progress Notes (Signed)
Encounter reviewed by Dr. Zoiey Christy Amundson C. Silva.  

## 2015-10-18 DIAGNOSIS — E669 Obesity, unspecified: Secondary | ICD-10-CM | POA: Insufficient documentation

## 2015-10-18 DIAGNOSIS — Z853 Personal history of malignant neoplasm of breast: Secondary | ICD-10-CM | POA: Insufficient documentation

## 2015-10-18 DIAGNOSIS — L820 Inflamed seborrheic keratosis: Secondary | ICD-10-CM | POA: Insufficient documentation

## 2015-10-18 DIAGNOSIS — J45909 Unspecified asthma, uncomplicated: Secondary | ICD-10-CM | POA: Insufficient documentation

## 2015-11-23 ENCOUNTER — Encounter: Payer: Self-pay | Admitting: Nurse Practitioner

## 2015-11-25 DIAGNOSIS — M2010 Hallux valgus (acquired), unspecified foot: Secondary | ICD-10-CM | POA: Insufficient documentation

## 2016-01-17 ENCOUNTER — Ambulatory Visit: Payer: Federal, State, Local not specified - PPO | Admitting: Nurse Practitioner

## 2016-01-25 ENCOUNTER — Ambulatory Visit (INDEPENDENT_AMBULATORY_CARE_PROVIDER_SITE_OTHER): Payer: Federal, State, Local not specified - PPO | Admitting: Nurse Practitioner

## 2016-01-25 ENCOUNTER — Encounter: Payer: Self-pay | Admitting: Nurse Practitioner

## 2016-01-25 VITALS — BP 138/88 | HR 64 | Resp 12 | Ht 65.75 in | Wt 249.6 lb

## 2016-01-25 DIAGNOSIS — Z01419 Encounter for gynecological examination (general) (routine) without abnormal findings: Secondary | ICD-10-CM | POA: Diagnosis not present

## 2016-01-25 DIAGNOSIS — C50512 Malignant neoplasm of lower-outer quadrant of left female breast: Secondary | ICD-10-CM

## 2016-01-25 DIAGNOSIS — Z1159 Encounter for screening for other viral diseases: Secondary | ICD-10-CM

## 2016-01-25 DIAGNOSIS — Z0000 Encounter for general adult medical examination without abnormal findings: Secondary | ICD-10-CM | POA: Diagnosis not present

## 2016-01-25 DIAGNOSIS — R319 Hematuria, unspecified: Secondary | ICD-10-CM | POA: Diagnosis not present

## 2016-01-25 LAB — POCT URINALYSIS DIPSTICK
Bilirubin, UA: NEGATIVE
GLUCOSE UA: NEGATIVE
KETONES UA: NEGATIVE
Leukocytes, UA: NEGATIVE
Nitrite, UA: NEGATIVE
Protein, UA: NEGATIVE
Urobilinogen, UA: NEGATIVE
pH, UA: 7

## 2016-01-25 NOTE — Progress Notes (Signed)
Encounter reviewed by Dr. Brook Amundson C. Silva.  

## 2016-01-25 NOTE — Patient Instructions (Addendum)

## 2016-01-25 NOTE — Progress Notes (Signed)
65 y.o. G0P0000 Single  Caucasian Fe here for annual exam. No new health problems.  Since mom fell 2 yrs ago she has felt herself not doing as well physically.   Does well mentally.  Mother now age 77 some failing of health.   She is under Hospice care.  Patient's last menstrual period was 04/08/2003 (approximate).          Sexually active: Yes.    The current method of family planning is post menopausal status.    Exercising: Yes.    Walking Smoker:  no  Health Maintenance: Pap: 01/12/15 WNL neg HR HPV MMG: 11/18/15-BIRADS2-Benign, Density A, [facility]  Colonoscopy: 04/10/13-Mild Diverticulitis, Repeat 10 yrs BMD: 11/06/13-Normal, R neck: 0.993, T -0.3 TDaP: 01/03/2007 Shingles: not yet Prevnar 13: 11/25/15  Hep C and HIV: No Labs: PCP   reports that she has never smoked. She has never used smokeless tobacco. She reports that she drinks about 0.6 oz of alcohol per week . She reports that she does not use drugs.  Past Medical History:  Diagnosis Date  . Carpal tunnel syndrome   . 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 Sinus problem     Past Surgical History:  Procedure Laterality Date  . BREAST LUMPECTOMY Left 2009  . CARPAL TUNNEL RELEASE Bilateral 1993 & 2003  . TONSILLECTOMY  age 46     Current Outpatient Prescriptions  Medication Sig Dispense Refill  . Calcium Carbonate (CALTRATE 600 PO) Take 1 tablet by mouth daily.    Marland Kitchen co-enzyme Q-10 30 MG capsule Take 30 mg by mouth daily.    . Multiple Vitamin (MULTIVITAMIN PO) Take 1 capsule by mouth daily.     Marland Kitchen NATURAL PSYLLIUM FIBER PO Take 3 capsules by mouth 2 (two) times daily.     . Omega-3 Fatty Acids (FISH OIL) 1000 MG CAPS Take 1 capsule by mouth daily.      No current facility-administered medications for this visit.     Family History  Problem Relation Age of Onset  . Diabetes Mother   . Heart disease Mother   . Heart disease Father   . Heart failure Father   .  Colon cancer Neg Hx   . Cancer Paternal Aunt     lung ca    ROS:  Pertinent items are noted in HPI.  Otherwise, a comprehensive ROS was negative.  Exam:   BP 138/88 (BP Location: Right Arm, Patient Position: Sitting, Cuff Size: Large)   Pulse 64   Resp 12   Ht 5' 5.75" (1.67 m)   Wt 249 lb 9.6 oz (113.2 kg)   LMP 04/08/2003 (Approximate)   BMI 40.59 kg/m  Height: 5' 5.75" (167 cm) Ht Readings from Last 3 Encounters:  01/25/16 5' 5.75" (1.67 m)  01/12/15 5\' 6"  (1.676 m)  01/06/14 5' 5.75" (1.67 m)    General appearance: alert, cooperative and appears stated age Head: Normocephalic, without obvious abnormality, atraumatic Neck: no adenopathy, supple, symmetrical, trachea midline and thyroid normal to inspection and palpation Lungs: clear to auscultation bilaterally Breasts: normal appearance, no masses or tenderness Heart: regular rate and rhythm Abdomen: soft, non-tender; no masses,  no organomegaly Extremities: extremities normal, atraumatic, no cyanosis or edema Skin: Skin color, texture, turgor normal. No rashes or lesions Lymph nodes: Cervical, supraclavicular, and axillary nodes normal. No abnormal inguinal nodes palpated Neurologic: Grossly normal   Pelvic: External genitalia:  no lesions  Urethra:  normal appearing urethra with no masses, tenderness or lesions              Bartholin's and Skene's: normal                 Vagina: normal appearing vagina with normal color and discharge, no lesions              Cervix: anteverted              Pap taken: No. 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

## 2016-01-26 LAB — URINALYSIS, MICROSCOPIC ONLY
Bacteria, UA: NONE SEEN [HPF]
Casts: NONE SEEN [LPF]
Crystals: NONE SEEN [HPF]
RBC / HPF: NONE SEEN RBC/HPF (ref <=2)
SQUAMOUS EPITHELIAL / LPF: NONE SEEN [HPF] (ref <=5)
WBC, UA: NONE SEEN WBC/HPF (ref <=5)
Yeast: NONE SEEN [HPF]

## 2016-01-26 LAB — HEPATITIS C ANTIBODY: HCV AB: NEGATIVE

## 2016-01-26 LAB — HIV ANTIBODY (ROUTINE TESTING W REFLEX): HIV: NONREACTIVE

## 2017-01-26 ENCOUNTER — Ambulatory Visit: Payer: Federal, State, Local not specified - PPO | Admitting: Nurse Practitioner

## 2017-01-30 ENCOUNTER — Ambulatory Visit (INDEPENDENT_AMBULATORY_CARE_PROVIDER_SITE_OTHER): Payer: Federal, State, Local not specified - PPO | Admitting: Certified Nurse Midwife

## 2017-01-30 ENCOUNTER — Encounter: Payer: Self-pay | Admitting: Certified Nurse Midwife

## 2017-01-30 VITALS — BP 114/80 | HR 76 | Resp 16 | Ht 65.5 in | Wt 249.0 lb

## 2017-01-30 DIAGNOSIS — Z01419 Encounter for gynecological examination (general) (routine) without abnormal findings: Secondary | ICD-10-CM | POA: Diagnosis not present

## 2017-01-30 DIAGNOSIS — Z853 Personal history of malignant neoplasm of breast: Secondary | ICD-10-CM

## 2017-01-30 DIAGNOSIS — R319 Hematuria, unspecified: Secondary | ICD-10-CM

## 2017-01-30 DIAGNOSIS — Z0000 Encounter for general adult medical examination without abnormal findings: Secondary | ICD-10-CM

## 2017-01-30 LAB — POCT URINALYSIS DIPSTICK
BILIRUBIN UA: NEGATIVE
GLUCOSE UA: NEGATIVE
KETONES UA: NEGATIVE
LEUKOCYTES UA: NEGATIVE
NITRITE UA: NEGATIVE
PH UA: 5 (ref 5.0–8.0)
Protein, UA: NEGATIVE
Urobilinogen, UA: 0.2 E.U./dL

## 2017-01-30 NOTE — Patient Instructions (Signed)
EXERCISE AND DIET:  We recommended that you start or continue a regular exercise program for good health. Regular exercise means any activity that makes your heart beat faster and makes you sweat.  We recommend exercising at least 30 minutes per day at least 3 days a week, preferably 4 or 5.  We also recommend a diet low in fat and sugar.  Inactivity, poor dietary choices and obesity can cause diabetes, heart attack, stroke, and kidney damage, among others.    ALCOHOL AND SMOKING:  Women should limit their alcohol intake to no more than 7 drinks/beers/glasses of wine (combined, not each!) per week. Moderation of alcohol intake to this level decreases your risk of breast cancer and liver damage. And of course, no recreational drugs are part of a healthy lifestyle.  And absolutely no smoking or even second hand smoke. Most people know smoking can cause heart and lung diseases, but did you know it also contributes to weakening of your bones? Aging of your skin?  Yellowing of your teeth and nails?  CALCIUM AND VITAMIN D:  Adequate intake of calcium and Vitamin D are recommended.  The recommendations for exact amounts of these supplements seem to change often, but generally speaking 600 mg of calcium (either carbonate or citrate) and 800 units of Vitamin D per day seems prudent. Certain women may benefit from higher intake of Vitamin D.  If you are among these women, your doctor will have told you during your visit.    PAP SMEARS:  Pap smears, to check for cervical cancer or precancers,  have traditionally been done yearly, although recent scientific advances have shown that most women can have pap smears less often.  However, every woman still should have a physical exam from her gynecologist every year. It will include a breast check, inspection of the vulva and vagina to check for abnormal growths or skin changes, a visual exam of the cervix, and then an exam to evaluate the size and shape of the uterus and  ovaries.  And after 66 years of age, a rectal exam is indicated to check for rectal cancers. We will also provide age appropriate advice regarding health maintenance, like when you should have certain vaccines, screening for sexually transmitted diseases, bone density testing, colonoscopy, mammograms, etc.   MAMMOGRAMS:  All women over 40 years old should have a yearly mammogram. Many facilities now offer a "3D" mammogram, which may cost around $50 extra out of pocket. If possible,  we recommend you accept the option to have the 3D mammogram performed.  It both reduces the number of women who will be called back for extra views which then turn out to be normal, and it is better than the routine mammogram at detecting truly abnormal areas.    COLONOSCOPY:  Colonoscopy to screen for colon cancer is recommended for all women at age 50.  We know, you hate the idea of the prep.  We agree, BUT, having colon cancer and not knowing it is worse!!  Colon cancer so often starts as a polyp that can be seen and removed at colonscopy, which can quite literally save your life!  And if your first colonoscopy is normal and you have no family history of colon cancer, most women don't have to have it again for 10 years.  Once every ten years, you can do something that may end up saving your life, right?  We will be happy to help you get it scheduled when you are ready.    Be sure to check your insurance coverage so you understand how much it will cost.  It may be covered as a preventative service at no cost, but you should check your particular policy.      Hematuria, Adult Hematuria is blood in your urine. It can be caused by a bladder infection, kidney infection, prostate infection, kidney stone, or cancer of your urinary tract. Infections can usually be treated with medicine, and a kidney stone usually will pass through your urine. If neither of these is the cause of your hematuria, further workup to find out the reason may  be needed. It is very important that you tell your health care provider about any blood you see in your urine, even if the blood stops without treatment or happens without causing pain. Blood in your urine that happens and then stops and then happens again can be a symptom of a very serious condition. Also, pain is not a symptom in the initial stages of many urinary cancers. Follow these instructions at home:  Drink lots of fluid, 3-4 quarts a day. If you have been diagnosed with an infection, cranberry juice is especially recommended, in addition to large amounts of water.  Avoid caffeine, tea, and carbonated beverages because they tend to irritate the bladder.  Avoid alcohol because it may irritate the prostate.  Take all medicines as directed by your health care provider.  If you were prescribed an antibiotic medicine, finish it all even if you start to feel better.  If you have been diagnosed with a kidney stone, follow your health care provider's instructions regarding straining your urine to catch the stone.  Empty your bladder often. Avoid holding urine for long periods of time.  After a bowel movement, women should cleanse front to back. Use each tissue only once.  Empty your bladder before and after sexual intercourse if you are a female. Contact a health care provider if:  You develop back pain.  You have a fever.  You have a feeling of sickness in your stomach (nausea) or vomiting.  Your symptoms are not better in 3 days. Return sooner if you are getting worse. Get help right away if:  You develop severe vomiting and are unable to keep the medicine down.  You develop severe back or abdominal pain despite taking your medicines.  You begin passing a large amount of blood or clots in your urine.  You feel extremely weak or faint, or you pass out. This information is not intended to replace advice given to you by your health care provider. Make sure you discuss any  questions you have with your health care provider. Document Released: 04/24/2005 Document Revised: 09/30/2015 Document Reviewed: 12/23/2012 Elsevier Interactive Patient Education  2017 [employer].

## 2017-01-30 NOTE — Progress Notes (Signed)
66 y.o. G0P0000 Single  Caucasian Fe here for annual exam. Menopausal no HRT. Mother who has been ill for a while and now has passed. Emotionally doing OK. Used Hospice care. See Dr. Redmond Pulling for PCP for labs and prn. Out of follow up for breast cancer with oncology. Had PCP visit for URI this am, has medication to pick up at pharmacy. Denies any vaginal bleeding.Some vaginal dryness with no treatment. Occasional spontaneous leakage(rare) only with cough. NO urinary frequency or urgency or pain. Had BMD with PCP and was normal. Not sexually active. Planning time for self once this cough ends. No other health concerns today.  Patient's last menstrual period was 04/08/2003 (approximate).          Sexually active: No.  The current method of family planning is post menopausal status.    Exercising: Yes.    walking Smoker:  no  Health Maintenance: Pap:  01-12-15 neg HPV HR neg  12/2011 Neg. HR HPV:neg  History of Abnormal Pap: no MMG:  11-28-16 category a density birads 1:neg Self Breast exams: yes, occ Colonoscopy:  2014 f/u 25yrs BMD:   2018 normal  TDaP:  2018 Shingles: Unsure Pneumonia: 2017 Hep C and HIV: both neg 2017 Labs: PCP UA: RBC=Moderate   reports that she has never smoked. She has never used smokeless tobacco. She reports that she drinks about 0.6 oz of alcohol per week . She reports that she does not use drugs.  Past Medical History:  Diagnosis Date  . Carpal tunnel syndrome   . 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 Sinus problem     Past Surgical History:  Procedure Laterality Date  . BREAST LUMPECTOMY Left 2009  . CARPAL TUNNEL RELEASE Bilateral 1993 & 2003  . TONSILLECTOMY  age 67     Current Outpatient Prescriptions  Medication Sig Dispense Refill  . Multiple Vitamin (MULTIVITAMIN PO) Take 1 capsule by mouth daily.     Marland Kitchen NATURAL PSYLLIUM FIBER PO Take 3 capsules by mouth 2 (two) times daily.      No  current facility-administered medications for this visit.     Family History  Problem Relation Age of Onset  . Diabetes Mother   . Heart disease Mother   . Heart disease Father   . Heart failure Father   . Cancer Paternal Aunt        lung ca  . Colon cancer Neg Hx     ROS:  Pertinent items are noted in HPI.  Otherwise, a comprehensive ROS was negative.  Exam:   BP 114/80 (BP Location: Right Arm, Patient Position: Sitting, Cuff Size: Large)   Pulse 76   Resp 16   Ht 5' 5.5" (1.664 m)   Wt 249 lb (112.9 kg)   LMP 04/08/2003 (Approximate)   BMI 40.81 kg/m  Height: 5' 5.5" (166.4 cm) Ht Readings from Last 3 Encounters:  01/30/17 5' 5.5" (1.664 m)  01/25/16 5' 5.75" (1.67 m)  01/12/15 5\' 6"  (1.676 m)    General appearance: alert, cooperative and appears stated age Head: Normocephalic, without obvious abnormality, atraumatic Neck: no adenopathy, supple, symmetrical, trachea midline and thyroid normal to inspection and palpation Lungs: clear to auscultation bilaterally with occasional wheeze noted bilateral, no SOB CVAT negative bilateral Breasts: normal appearance, no masses or tenderness, No nipple retraction or dimpling, No nipple discharge or bleeding, No axillary or supraclavicular adenopathy Heart: regular rate and rhythm Abdomen:  soft, non-tender; no masses,  no organomegaly Extremities: extremities normal, atraumatic, no cyanosis or edema Skin: Skin color, texture, turgor normal. No rashes or lesions, warm and dry Lymph nodes: Cervical, supraclavicular, and axillary nodes normal. No abnormal inguinal nodes palpated Neurologic: Grossly normal   Pelvic: External genitalia:  no lesions, normal female              Urethra:  normal appearing urethra with no masses, tenderness or lesions  Bladder and urethral meatus non tender              Bartholin's and Skene's: normal                 Vagina: atrophic appearing vagina with normal color and discharge, no lesions               Cervix: no cervical motion tenderness, no lesions and nulliparous appearance              Pap taken: No. Bimanual Exam:  Uterus:  normal size, contour, position, consistency, mobility, non-tender and anteverted              Adnexa: normal adnexa and no mass, fullness, tenderness               Rectovaginal: Confirms               Anus:  normal sphincter tone, no lesions  Chaperone present: yes  A:  Well Woman with normal exam  Menopausal no HRT  History of breast cancer in 2009 with oncology follow up as needed  Asymptomatic hematuria, suspect vaginal origin due to Vaginal dryness   P:   Reviewed health and wellness pertinent to exam  Aware of need to advise if vaginal bleeding  Discussed importance of SBE and mammogram yearly.   Will do micro urine to make sure no hematuria  Discussed vaginal dryness finding and coconut oil option for management. Instructions given. Patient will advise if any concerns or UTI symptoms.  Pap smear: no   counseled on breast self exam, mammography screening, feminine hygiene, adequate intake of calcium and vitamin D, diet and exercise  return annually or prn  An After Visit Summary was printed and given to the patient.

## 2017-01-31 LAB — URINALYSIS, MICROSCOPIC ONLY
BACTERIA UA: NONE SEEN
CASTS: NONE SEEN /LPF

## 2017-12-20 ENCOUNTER — Encounter: Payer: Self-pay | Admitting: Certified Nurse Midwife

## 2018-01-31 ENCOUNTER — Other Ambulatory Visit: Payer: Self-pay

## 2018-01-31 ENCOUNTER — Ambulatory Visit: Payer: Federal, State, Local not specified - PPO | Admitting: Certified Nurse Midwife

## 2018-01-31 ENCOUNTER — Encounter: Payer: Self-pay | Admitting: Certified Nurse Midwife

## 2018-01-31 ENCOUNTER — Other Ambulatory Visit (HOSPITAL_COMMUNITY)
Admission: RE | Admit: 2018-01-31 | Discharge: 2018-01-31 | Disposition: A | Discharge status: Home / Self Care | Payer: Federal, State, Local not specified - PPO | Source: Ambulatory Visit | Attending: Certified Nurse Midwife | Admitting: Certified Nurse Midwife

## 2018-01-31 VITALS — BP 120/80 | HR 70 | Resp 16 | Ht 65.5 in | Wt 261.0 lb

## 2018-01-31 DIAGNOSIS — Z124 Encounter for screening for malignant neoplasm of cervix: Secondary | ICD-10-CM

## 2018-01-31 DIAGNOSIS — Z01419 Encounter for gynecological examination (general) (routine) without abnormal findings: Secondary | ICD-10-CM

## 2018-01-31 DIAGNOSIS — Z0000 Encounter for general adult medical examination without abnormal findings: Secondary | ICD-10-CM | POA: Diagnosis not present

## 2018-01-31 DIAGNOSIS — N951 Menopausal and female climacteric states: Secondary | ICD-10-CM | POA: Diagnosis not present

## 2018-01-31 DIAGNOSIS — R635 Abnormal weight gain: Secondary | ICD-10-CM

## 2018-01-31 DIAGNOSIS — D0512 Intraductal carcinoma in situ of left breast: Secondary | ICD-10-CM

## 2018-01-31 DIAGNOSIS — Z853 Personal history of malignant neoplasm of breast: Secondary | ICD-10-CM

## 2018-01-31 LAB — POCT URINALYSIS DIPSTICK
BILIRUBIN UA: NEGATIVE
Glucose, UA: NEGATIVE
KETONES UA: NEGATIVE
Leukocytes, UA: NEGATIVE
Nitrite, UA: NEGATIVE
PH UA: 5 (ref 5.0–8.0)
Protein, UA: NEGATIVE
RBC UA: NEGATIVE
UROBILINOGEN UA: NEGATIVE U/dL — AB

## 2018-01-31 NOTE — Addendum Note (Signed)
Addended by: Regina Eck on: 01/31/2018 02:15 PM   Modules accepted: Level of Service

## 2018-01-31 NOTE — Progress Notes (Signed)
67 y.o. G0P0000 Single  Caucasian Fe here for annual exam. Aware of need to lose weight she gained after mother passed last year and is going to weight watchers and back to gym. Feeling much better. Sees Dr. Redmond Pulling for aex, labs. Recent Flu/ Shingles vaccine. Using coconut oil with good response for vaginal dryness. Denies vaginal bleeding. Not sexually active. Has noted ? Increase in fat in axillary area over the past few months. Mammogram was normal. No pain or lumps noted. Please check. No other issues today. [place] trip with friend!  Patient's last menstrual period was 04/08/2003 (approximate).          Sexually active: No.  The current method of family planning is post menopausal status.    Exercising: Yes.    walking, water class Smoker:  no  Review of Systems  Constitutional: Negative.   HENT: Negative.   Eyes: Negative.   Respiratory: Negative.   Cardiovascular: Negative.   Gastrointestinal: Negative.   Genitourinary: Negative.   Musculoskeletal: Negative.   Skin: Negative.   Neurological: Negative.   Endo/Heme/Allergies: Negative.   Psychiatric/Behavioral: Negative.     Health Maintenance: Pap:  01-12-15 neg HPV HR neg History of Abnormal Pap: no MMG:  11-29-17 category b density birads 2:neg Self Breast exams: occ Colonoscopy:  2014 f/u 61yrs BMD:   2018 TDaP:  2018 Shingles: 2019 Pneumonia: 2017,2018 Hep C and HIV: both neg 2017 Labs: poct urine-neg   reports that she has never smoked. She has never used smokeless tobacco. She reports that she drinks alcohol. She reports that she does not use drugs.  Past Medical History:  Diagnosis Date  . Carpal tunnel syndrome   . 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 Sinus problem     Past Surgical History:  Procedure Laterality Date  . BREAST LUMPECTOMY Left 2009  . CARPAL TUNNEL RELEASE Bilateral 1993 & 2003  . TONSILLECTOMY  age 38     Current  Outpatient Medications  Medication Sig Dispense Refill  . Multiple Vitamin (MULTIVITAMIN PO) Take 1 capsule by mouth.     Marland Kitchen NATURAL PSYLLIUM FIBER PO Take 3 capsules by mouth.      No current facility-administered medications for this visit.     Family History  Problem Relation Age of Onset  . Diabetes Mother   . Heart disease Mother   . Heart disease Father   . Heart failure Father   . Cancer Paternal Aunt        lung ca  . Colon cancer Neg Hx     ROS:  Pertinent items are noted in HPI.  Otherwise, a comprehensive ROS was negative.  Exam:   BP 120/80   Pulse 70   Resp 16   Ht 5' 5.5" (1.664 m)   Wt 261 lb (118.4 kg)   LMP 04/08/2003 (Approximate)   BMI 42.77 kg/m  Height: 5' 5.5" (166.4 cm) Ht Readings from Last 3 Encounters:  01/31/18 5' 5.5" (1.664 m)  01/30/17 5' 5.5" (1.664 m)  01/25/16 5' 5.75" (1.67 m)    General appearance: alert, cooperative and appears stated age Head: Normocephalic, without obvious abnormality, atraumatic Neck: no adenopathy, supple, symmetrical, trachea midline and thyroid normal to inspection and palpation Lungs: clear to auscultation bilaterally Breasts: normal appearance, no masses or tenderness, No nipple retraction or dimpling, No nipple discharge or bleeding, No axillary or supraclavicular adenopathy  Right breast fat and stretched  skin noted in axillary only. No masses noted Heart: regular rate and rhythm Abdomen: soft, non-tender; no masses,  no organomegaly Extremities: extremities normal, atraumatic, no cyanosis or edema Skin: Skin color, texture, turgor normal. No rashes or lesions Lymph nodes: Cervical, supraclavicular, and axillary nodes normal. No abnormal inguinal nodes palpated Neurologic: Grossly normal   Pelvic: External genitalia:  no lesions, normal female              Urethra:  normal appearing urethra with no masses, tenderness or lesions              Bartholin's and Skene's: normal                 Vagina: normal  appearing vagina with normal color and discharge, no lesions              Cervix: no cervical motion tenderness and no lesions              Pap taken: Yes.   Bimanual Exam:  Uterus:  normal size, contour, position, consistency, mobility, non-tender and anteflexed              Adnexa: normal adnexa and no mass, fullness, tenderness               Rectovaginal: Confirms               Anus:  normal sphincter tone, no lesions  Chaperone present: yes  A:  Well Woman with normal exam  Menopausal with vaginal dryness using coconut oil with good response  PCP management of labs and aex  History of DCIS left breast off Tamoxifen now  P:   Reviewed health and wellness pertinent to exam  Aware to advise if vaginal bleeding or vaginal dryness not responding to coconut oil  Continue follow up with PCP as indicate  Pap smear: yes   counseled on breast self exam, mammography screening, feminine hygiene, adequate intake of calcium and vitamin D, diet and exercise  return annually or prn  An After Visit Summary was printed and given to the patient.

## 2018-01-31 NOTE — Patient Instructions (Signed)

## 2018-02-05 LAB — CYTOLOGY - PAP
DIAGNOSIS: NEGATIVE
HPV (WINDOPATH): NOT DETECTED

## 2018-05-14 DIAGNOSIS — E7800 Pure hypercholesterolemia, unspecified: Secondary | ICD-10-CM | POA: Insufficient documentation

## 2018-12-04 ENCOUNTER — Telehealth: Payer: Self-pay | Admitting: Certified Nurse Midwife

## 2018-12-04 NOTE — Telephone Encounter (Signed)
Patient will need order for bone density fax to [facility].

## 2018-12-04 NOTE — Telephone Encounter (Signed)
BMD order faxed to [facility].   Patient notified.   Encounter closed.

## 2018-12-04 NOTE — Telephone Encounter (Signed)
Last AEX 01/31/18 Next AEX 02/10/19 Last BMD 11/28/16: Normal  Hx of breast cancer, s/p radiation, 33yrs tamoxifen tx. Postmenopausal, estrogen deficiency.   Melvia Heaps, CNM -please review, ok to proceed with BMD order to [facility]?

## 2018-12-16 ENCOUNTER — Encounter: Payer: Self-pay | Admitting: Certified Nurse Midwife

## 2018-12-18 ENCOUNTER — Telehealth: Payer: Self-pay

## 2018-12-18 NOTE — Telephone Encounter (Signed)
Patient notified of normal bone density result. See scanned in result.

## 2018-12-25 ENCOUNTER — Telehealth: Payer: Self-pay

## 2018-12-25 NOTE — Telephone Encounter (Signed)
Notified patient of bone density results. See scanned in results.

## 2019-02-05 ENCOUNTER — Ambulatory Visit: Payer: Federal, State, Local not specified - PPO | Admitting: Certified Nurse Midwife

## 2019-02-06 ENCOUNTER — Other Ambulatory Visit: Payer: Self-pay

## 2019-02-10 ENCOUNTER — Other Ambulatory Visit: Payer: Self-pay

## 2019-02-10 ENCOUNTER — Ambulatory Visit (INDEPENDENT_AMBULATORY_CARE_PROVIDER_SITE_OTHER): Payer: Federal, State, Local not specified - PPO | Admitting: Certified Nurse Midwife

## 2019-02-10 ENCOUNTER — Encounter: Payer: Self-pay | Admitting: Certified Nurse Midwife

## 2019-02-10 VITALS — BP 120/70 | HR 68 | Temp 97.0°F | Resp 16 | Ht 65.25 in | Wt 250.0 lb

## 2019-02-10 DIAGNOSIS — Z01419 Encounter for gynecological examination (general) (routine) without abnormal findings: Secondary | ICD-10-CM

## 2019-02-10 NOTE — Patient Instructions (Signed)
EXERCISE AND DIET:  We recommended that you start or continue a regular exercise program for good health. Regular exercise means any activity that makes your heart beat faster and makes you sweat.  We recommend exercising at least 30 minutes per day at least 3 days a week, preferably 4 or 5.  We also recommend a diet low in fat and sugar.  Inactivity, poor dietary choices and obesity can cause diabetes, heart attack, stroke, and kidney damage, among others.   ° °ALCOHOL AND SMOKING:  Women should limit their alcohol intake to no more than 7 drinks/beers/glasses of wine (combined, not each!) per week. Moderation of alcohol intake to this level decreases your risk of breast cancer and liver damage. And of course, no recreational drugs are part of a healthy lifestyle.  And absolutely no smoking or even second hand smoke. Most people know smoking can cause heart and lung diseases, but did you know it also contributes to weakening of your bones? Aging of your skin?  Yellowing of your teeth and nails? ° °CALCIUM AND VITAMIN D:  Adequate intake of calcium and Vitamin D are recommended.  The recommendations for exact amounts of these supplements seem to change often, but generally speaking 600 mg of calcium (either carbonate or citrate) and 800 units of Vitamin D per day seems prudent. Certain women may benefit from higher intake of Vitamin D.  If you are among these women, your doctor will have told you during your visit.   ° °PAP SMEARS:  Pap smears, to check for cervical cancer or precancers,  have traditionally been done yearly, although recent scientific advances have shown that most women can have pap smears less often.  However, every woman still should have a physical exam from her gynecologist every year. It will include a breast check, inspection of the vulva and vagina to check for abnormal growths or skin changes, a visual exam of the cervix, and then an exam to evaluate the size and shape of the uterus and  ovaries.  And after 68 years of age, a rectal exam is indicated to check for rectal cancers. We will also provide age appropriate advice regarding health maintenance, like when you should have certain vaccines, screening for sexually transmitted diseases, bone density testing, colonoscopy, mammograms, etc.  ° °MAMMOGRAMS:  All women over 40 years old should have a yearly mammogram. Many facilities now offer a "3D" mammogram, which may cost around $50 extra out of pocket. If possible,  we recommend you accept the option to have the 3D mammogram performed.  It both reduces the number of women who will be called back for extra views which then turn out to be normal, and it is better than the routine mammogram at detecting truly abnormal areas.   ° °COLONOSCOPY:  Colonoscopy to screen for colon cancer is recommended for all women at age 50.  We know, you hate the idea of the prep.  We agree, BUT, having colon cancer and not knowing it is worse!!  Colon cancer so often starts as a polyp that can be seen and removed at colonscopy, which can quite literally save your life!  And if your first colonoscopy is normal and you have no family history of colon cancer, most women don't have to have it again for 10 years.  Once every ten years, you can do something that may end up saving your life, right?  We will be happy to help you get it scheduled when you are ready.    Be sure to check your insurance coverage so you understand how much it will cost.  It may be covered as a preventative service at no cost, but you should check your particular policy.     Continue the exercise!!!! Kegel Exercises  Kegel exercises can help strengthen your pelvic floor muscles. The pelvic floor is a group of muscles that support your rectum, small intestine, and bladder. In females, pelvic floor muscles also help support the womb (uterus). These muscles help you control the flow of urine and stool. Kegel exercises are painless and simple, and  they do not require any equipment. Your provider may suggest Kegel exercises to:  Improve bladder and bowel control.  Improve sexual response.  Improve weak pelvic floor muscles after surgery to remove the uterus (hysterectomy) or pregnancy (females).  Improve weak pelvic floor muscles after prostate gland removal or surgery (males). Kegel exercises involve squeezing your pelvic floor muscles, which are the same muscles you squeeze when you try to stop the flow of urine or keep from passing gas. The exercises can be done while sitting, standing, or lying down, but it is best to vary your position. Exercises How to do Kegel exercises: 1. Squeeze your pelvic floor muscles tight. You should feel a tight lift in your rectal area. If you are a female, you should also feel a tightness in your vaginal area. Keep your stomach, buttocks, and legs relaxed. 2. Hold the muscles tight for up to 10 seconds. 3. Breathe normally. 4. Relax your muscles. 5. Repeat as told by your health care provider. Repeat this exercise daily as told by your health care provider. Continue to do this exercise for at least 4-6 weeks, or for as long as told by your health care provider. You may be referred to a physical therapist who can help you learn more about how to do Kegel exercises. Depending on your condition, your health care provider may recommend:  Varying how long you squeeze your muscles.  Doing several sets of exercises every day.  Doing exercises for several weeks.  Making Kegel exercises a part of your regular exercise routine. This information is not intended to replace advice given to you by your health care provider. Make sure you discuss any questions you have with your health care provider. Document Released: 04/10/2012 Document Revised: 12/12/2017 Document Reviewed: 12/12/2017 Elsevier Patient Education  2020 [employer].

## 2019-02-10 NOTE — Progress Notes (Addendum)
68 y.o. G0P0000 Single  Caucasian Fe here for annual exam. Post menopausal no symptoms. Denies vaginal bleeding or dryness. Working on weight loss and good diet to help with cholesterol. Sees PCP for aex and labs. Has had mammogram. 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 No changes. Has noted slight increase in stress incontinence, not doing kegel exercise. No other health issues.  Patient's last menstrual period was 04/08/2003 (approximate).          Sexually active: No.  The current method of family planning is post menopausal status.    Exercising: Yes.    walking Smoker:  no  Review of Systems  Constitutional: Negative.   HENT: Negative.   Eyes: Negative.   Respiratory: Negative.   Cardiovascular: Negative.   Gastrointestinal: Negative.   Genitourinary: Negative.   Musculoskeletal: Negative.   Skin: Negative.   Neurological: Negative.   Endo/Heme/Allergies: Negative.   Psychiatric/Behavioral: Negative.     Health Maintenance: Pap:  01-12-15 neg HPV HR neg, 01-31-18 neg HPV HR neg History of Abnormal Pap: no MMG:  12-16-2018 category a density birads 2:neg Self Breast exams: occ Colonoscopy: 2014 f/u 67yrs BMD:   2020 TDaP:  2018 Shingles: 2019 Pneumonia: 2018 Hep C and HIV: both neg 2017 Labs: PCP   reports that she has never smoked. She has never used smokeless tobacco. She reports current alcohol use. She reports that she does not use drugs.  Past Medical History:  Diagnosis Date  . Carpal tunnel syndrome   . 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 Sinus problem     Past Surgical History:  Procedure Laterality Date  . BREAST LUMPECTOMY Left 2009  . CARPAL TUNNEL RELEASE Bilateral 1993 & 2003  . TONSILLECTOMY  age 76     Current Outpatient Medications  Medication Sig Dispense Refill  . CALCIUM PO + d & k    . Multiple Vitamin (MULTIVITAMIN PO) Take 1 capsule by mouth.     Marland Kitchen NATURAL PSYLLIUM  FIBER PO Take 3 capsules by mouth.      No current facility-administered medications for this visit.     Family History  Problem Relation Age of Onset  . Diabetes Mother   . Heart disease Mother   . Heart disease Father   . Heart failure Father   . Cancer Paternal Aunt        lung ca  . Colon cancer Neg Hx     ROS:  Pertinent items are noted in HPI.  Otherwise, a comprehensive ROS was negative.  Exam:   BP 120/70   Pulse 68   Temp (!) 97 F (36.1 C) (Skin)   Resp 16   Ht 5' 5.25" (1.657 m)   Wt 250 lb (113.4 kg)   LMP 04/08/2003 (Approximate)   BMI 41.28 kg/m  Height: 5' 5.25" (165.7 cm) Ht Readings from Last 3 Encounters:  02/10/19 5' 5.25" (1.657 m)  01/31/18 5' 5.5" (1.664 m)  01/30/17 5' 5.5" (1.664 m)    General appearance: alert, cooperative and appears stated age Head: Normocephalic, without obvious abnormality, atraumatic Neck: no adenopathy, supple, symmetrical, trachea midline and thyroid normal to inspection and palpation Lungs: clear to auscultation bilaterally Breasts: normal appearance, no masses or tenderness, No nipple retraction or dimpling, No nipple discharge or bleeding, No axillary or supraclavicular adenopathy Heart: regular rate and rhythm Abdomen: soft, non-tender; no masses,  no organomegaly Extremities: extremities normal, atraumatic, no  cyanosis or edema Skin: Skin color, texture, turgor normal. No rashes or lesions Lymph nodes: Cervical, supraclavicular, and axillary nodes normal. No abnormal inguinal nodes palpated Neurologic: Grossly normal   Pelvic: External genitalia:  no lesions              Urethra:  normal appearing urethra with no masses, tenderness or lesions              Bartholin's and Skene's: normal                 Vagina: normal appearing vagina with normal color and discharge, no lesions              Cervix: no cervical motion tenderness, no lesions and normal appearance              Pap taken: No. Bimanual Exam:   Uterus:  normal size, contour, position, consistency, mobility, non-tender and anteverted              Adnexa: normal adnexa and no mass, fullness, tenderness               Rectovaginal: Confirms               Anus:  normal sphincter tone, no lesions  Chaperone present: yes  A:  Well Woman with normal exam  Post menopausal  Vaginal dryness using coconut oil with good response  Stress incontinence occasional  Intentional weight loss   P:   Reviewed health and wellness pertinent to exam  Aware of need to advise if vaginal bleeding  Discussed appearance has improved and continue use  Discussed not holding urine for long periods of time and working on kegel exercise. Questions addressed.  Encouraged to continue weight loss journey for improved health.  Pap smear: no   counseled on breast self exam, mammography screening, feminine hygiene, adequate intake of calcium and vitamin D, diet and exercise, Kegel's exercises  return annually or prn  An After Visit Summary was printed and given to the patient.

## 2019-02-13 ENCOUNTER — Ambulatory Visit: Payer: Federal, State, Local not specified - PPO | Admitting: Certified Nurse Midwife

## 2019-02-19 ENCOUNTER — Other Ambulatory Visit: Payer: Self-pay

## 2019-02-19 DIAGNOSIS — Z20822 Contact with and (suspected) exposure to covid-19: Secondary | ICD-10-CM

## 2019-02-20 LAB — NOVEL CORONAVIRUS, NAA: SARS-CoV-2, NAA: NOT DETECTED

## 2019-06-06 ENCOUNTER — Ambulatory Visit: Payer: Federal, State, Local not specified - PPO

## 2019-06-14 ENCOUNTER — Ambulatory Visit: Payer: Federal, State, Local not specified - PPO | Attending: Internal Medicine

## 2019-06-14 DIAGNOSIS — Z23 Encounter for immunization: Secondary | ICD-10-CM

## 2019-06-14 NOTE — Progress Notes (Signed)
   Covid-19 Vaccination Clinic  Name:  MAKYLAH AABERG    MRN: [id number] DOB: [birth year]  06/14/2019  Ms. Prusha was observed post Covid-19 immunization for 15 minutes without incidence. She was provided with Vaccine Information Sheet and instruction to access the V-Safe system.   Ms. Darrington was instructed to call 911 with any severe reactions post vaccine: Marland Kitchen Difficulty breathing  . Swelling of your face and throat  . A fast heartbeat  . A bad rash all over your body  . Dizziness and weakness    Immunizations Administered    Name Date Dose VIS Date Route   Pfizer COVID-19 Vaccine 06/14/2019  4:39 PM 0.3 mL 04/18/2019 Intramuscular   Manufacturer: [place]   Lot: [id number]   [place]: [id number]

## 2019-06-27 ENCOUNTER — Ambulatory Visit: Payer: Federal, State, Local not specified - PPO

## 2019-07-09 ENCOUNTER — Ambulatory Visit: Payer: Federal, State, Local not specified - PPO | Attending: Internal Medicine

## 2019-07-09 DIAGNOSIS — Z23 Encounter for immunization: Secondary | ICD-10-CM | POA: Insufficient documentation

## 2019-07-09 NOTE — Progress Notes (Signed)
   Covid-19 Vaccination Clinic  Name:  Bonnie Collins    MRN: [id number] DOB: 02/17/1951  07/09/2019  Bonnie Collins was observed post Covid-19 immunization for 15 minutes without incident. She was provided with Vaccine Information Sheet and instruction to access the V-Safe system.   Bonnie Collins was instructed to call 911 with any severe reactions post vaccine: Marland Kitchen Difficulty breathing  . Swelling of face and throat  . A fast heartbeat  . A bad rash all over body  . Dizziness and weakness   Immunizations Administered    Name Date Dose VIS Date Route   Pfizer COVID-19 Vaccine 07/09/2019 11:36 AM 0.3 mL 04/18/2019 Intramuscular   Manufacturer: [place]   Lot: [id number]   [place]: [id number]

## 2019-07-29 ENCOUNTER — Encounter: Payer: Self-pay | Admitting: Certified Nurse Midwife

## 2019-12-10 DIAGNOSIS — G8929 Other chronic pain: Secondary | ICD-10-CM | POA: Insufficient documentation

## 2019-12-10 DIAGNOSIS — M25561 Pain in right knee: Secondary | ICD-10-CM | POA: Insufficient documentation

## 2020-02-11 ENCOUNTER — Ambulatory Visit: Payer: Federal, State, Local not specified - PPO | Admitting: Certified Nurse Midwife

## 2020-02-17 NOTE — Progress Notes (Signed)
69 y.o. G0P0000 Single White or Caucasian Not Hispanic or Latino female here for annual exam.  No vaginal bleeding, no bowel or bladder c/o. Not sexually active.  H/O DCIS left breast.  She is planning to see Dr Redmond Pulling.   Always struggles with her weight. She has started back to exercise in the last several months. Eats healthy off and on. Has done well on weight watchers in the past.     Patient's last menstrual period was 04/08/2003 (approximate).          Sexually active: No.  The current method of family planning is none.    Exercising: Yes.    walking and small group personal training  Smoker:  no  Health Maintenance: Pap:  01-31-18 neg HPV HR neg, 01-12-15 neg HPV HR neg History of abnormal Pap:  no MMG:had one this summer report has been requested  BMD:   12/16/18 normal  Colonoscopy: 2014 f/u 86yrs  TDaP: 2018  Gardasil: NA   reports that she has never smoked. She has never used smokeless tobacco. She reports current alcohol use. She reports that she does not use drugs. 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   Past Medical History:  Diagnosis Date  . Carpal tunnel syndrome   . 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 Sinus problem     Past Surgical History:  Procedure Laterality Date  . BREAST LUMPECTOMY Left 2009  . CARPAL TUNNEL RELEASE Bilateral 1993 & 2003  . TONSILLECTOMY  age 24     Current Outpatient Medications  Medication Sig Dispense Refill  . CALCIUM PO + d & k    . Multiple Vitamin (MULTIVITAMIN PO) Take 1 capsule by mouth.     Marland Kitchen NATURAL PSYLLIUM FIBER PO Take 3 capsules by mouth.      No current facility-administered medications for this visit.    Family History  Problem Relation Age of Onset  . Diabetes Mother   . Heart disease Mother   . Heart disease Father   . Heart failure Father   . Cancer Paternal Aunt        lung ca  . Colon cancer Neg Hx     Review of  Systems  All other systems reviewed and are negative.   Exam:   BP 122/72   Pulse 75   Ht 5\' 6"  (1.676 m)   Wt 244 lb (110.7 kg)   LMP 04/08/2003 (Approximate)   SpO2 97%   BMI 39.38 kg/m   Weight change: @WEIGHTCHANGE @ Height:   Height: 5\' 6"  (167.6 cm)  Ht Readings from Last 3 Encounters:  02/18/20 5\' 6"  (1.676 m)  02/10/19 5' 5.25" (1.657 m)  01/31/18 5' 5.5" (1.664 m)    General appearance: alert, cooperative and appears stated age Head: Normocephalic, without obvious abnormality, atraumatic Neck: no adenopathy, supple, symmetrical, trachea midline and thyroid normal to inspection and palpation Breasts: normal appearance, no masses or tenderness, evidence of left lumpectomy Abdomen: soft, non-tender; non distended,  no masses,  no organomegaly Extremities: extremities normal, atraumatic, no cyanosis or edema Skin: Skin color, texture, turgor normal. No rashes or lesions Lymph nodes: Cervical, supraclavicular, and axillary nodes normal. No abnormal inguinal nodes palpated Neurologic: Grossly normal   Pelvic: External genitalia:  no lesions              Urethra:  normal appearing urethra with no masses, tenderness  or lesions              Bartholins and Skenes: normal                 Vagina: atrophic appearing vagina with normal color and discharge, no lesions              Cervix: no lesions               Bimanual Exam:  Uterus:  exam limited by BMI, no masses or tenderness              Adnexa: no mass, fullness, tenderness               Rectovaginal: Confirms               Anus:  normal sphincter tone, no lesions  Terence Lux chaperoned for the exam.  A:  Well Woman with normal exam  BMI 39, discussed weight loss  P:   Pap with reflex  Labs with primary  DEXA UTD  Mammogram done this summer (will request a copy)  Colonoscopy UTD  Given card for medical weight loss clinic

## 2020-02-18 ENCOUNTER — Ambulatory Visit: Payer: Medicare Other | Admitting: Obstetrics and Gynecology

## 2020-02-18 ENCOUNTER — Encounter: Payer: Self-pay | Admitting: Obstetrics and Gynecology

## 2020-02-18 ENCOUNTER — Other Ambulatory Visit: Payer: Self-pay

## 2020-02-18 ENCOUNTER — Other Ambulatory Visit (HOSPITAL_COMMUNITY)
Admission: RE | Admit: 2020-02-18 | Discharge: 2020-02-18 | Disposition: A | Discharge status: Home / Self Care | Payer: Federal, State, Local not specified - PPO | Source: Ambulatory Visit | Attending: Cardiology | Admitting: Cardiology

## 2020-02-18 VITALS — BP 122/72 | HR 75 | Ht 66.0 in | Wt 244.0 lb

## 2020-02-18 DIAGNOSIS — Z6839 Body mass index (BMI) 39.0-39.9, adult: Secondary | ICD-10-CM

## 2020-02-18 DIAGNOSIS — Z9189 Other specified personal risk factors, not elsewhere classified: Secondary | ICD-10-CM

## 2020-02-18 DIAGNOSIS — Z01419 Encounter for gynecological examination (general) (routine) without abnormal findings: Secondary | ICD-10-CM

## 2020-02-18 DIAGNOSIS — Z124 Encounter for screening for malignant neoplasm of cervix: Secondary | ICD-10-CM | POA: Diagnosis present

## 2020-02-18 DIAGNOSIS — Z853 Personal history of malignant neoplasm of breast: Secondary | ICD-10-CM

## 2020-02-18 NOTE — Patient Instructions (Signed)
EXERCISE AND DIET:  We recommended that you start or continue a regular exercise program for good health. Regular exercise means any activity that makes your heart beat faster and makes you sweat.  We recommend exercising at least 30 minutes per day at least 3 days a week, preferably 4 or 5.  We also recommend a diet low in fat and sugar.  Inactivity, poor dietary choices and obesity can cause diabetes, heart attack, stroke, and kidney damage, among others.    ALCOHOL AND SMOKING:  Women should limit their alcohol intake to no more than 7 drinks/beers/glasses of wine (combined, not each!) per week. Moderation of alcohol intake to this level decreases your risk of breast cancer and liver damage. And of course, no recreational drugs are part of a healthy lifestyle.  And absolutely no smoking or even second hand smoke. Most people know smoking can cause heart and lung diseases, but did you know it also contributes to weakening of your bones? Aging of your skin?  Yellowing of your teeth and nails?  CALCIUM AND VITAMIN D:  Adequate intake of calcium and Vitamin D are recommended.  The recommendations for exact amounts of these supplements seem to change often, but generally speaking 1,200 mg of calcium (between diet and supplement) and 800 units of Vitamin D per day seems prudent. Certain women may benefit from higher intake of Vitamin D.  If you are among these women, your doctor will have told you during your visit.    PAP SMEARS:  Pap smears, to check for cervical cancer or precancers,  have traditionally been done yearly, although recent scientific advances have shown that most women can have pap smears less often.  However, every woman still should have a physical exam from her gynecologist every year. It will include a breast check, inspection of the vulva and vagina to check for abnormal growths or skin changes, a visual exam of the cervix, and then an exam to evaluate the size and shape of the uterus and  ovaries.  And after 69 years of age, a rectal exam is indicated to check for rectal cancers. We will also provide age appropriate advice regarding health maintenance, like when you should have certain vaccines, screening for sexually transmitted diseases, bone density testing, colonoscopy, mammograms, etc.   MAMMOGRAMS:  All women over 40 years old should have a yearly mammogram. Many facilities now offer a "3D" mammogram, which may cost around $50 extra out of pocket. If possible,  we recommend you accept the option to have the 3D mammogram performed.  It both reduces the number of women who will be called back for extra views which then turn out to be normal, and it is better than the routine mammogram at detecting truly abnormal areas.    COLON CANCER SCREENING: Now recommend starting at age 45. At this time colonoscopy is not covered for routine screening until 50. There are take home tests that can be done between 45-49.   COLONOSCOPY:  Colonoscopy to screen for colon cancer is recommended for all women at age 50.  We know, you hate the idea of the prep.  We agree, BUT, having colon cancer and not knowing it is worse!!  Colon cancer so often starts as a polyp that can be seen and removed at colonscopy, which can quite literally save your life!  And if your first colonoscopy is normal and you have no family history of colon cancer, most women don't have to have it again for   10 years.  Once every ten years, you can do something that may end up saving your life, right?  We will be happy to help you get it scheduled when you are ready.  Be sure to check your insurance coverage so you understand how much it will cost.  It may be covered as a preventative service at no cost, but you should check your particular policy.      Breast Self-Awareness Breast self-awareness means being familiar with how your breasts look and feel. It involves checking your breasts regularly and reporting any changes to your  health care provider. Practicing breast self-awareness is important. A change in your breasts can be a sign of a serious medical problem. Being familiar with how your breasts look and feel allows you to find any problems early, when treatment is more likely to be successful. All women should practice breast self-awareness, including women who have had breast implants. How to do a breast self-exam One way to learn what is normal for your breasts and whether your breasts are changing is to do a breast self-exam. To do a breast self-exam: Look for Changes  1. Remove all the clothing above your waist. 2. Stand in front of a mirror in a room with good lighting. 3. Put your hands on your hips. 4. Push your hands firmly downward. 5. Compare your breasts in the mirror. Look for differences between them (asymmetry), such as: ? Differences in shape. ? Differences in size. ? Puckers, dips, and bumps in one breast and not the other. 6. Look at each breast for changes in your skin, such as: ? Redness. ? Scaly areas. 7. Look for changes in your nipples, such as: ? Discharge. ? Bleeding. ? Dimpling. ? Redness. ? A change in position. Feel for Changes Carefully feel your breasts for lumps and changes. It is best to do this while lying on your back on the floor and again while sitting or standing in the shower or tub with soapy water on your skin. Feel each breast in the following way:  Place the arm on the side of the breast you are examining above your head.  Feel your breast with the other hand.  Start in the nipple area and make  inch (2 cm) overlapping circles to feel your breast. Use the pads of your three middle fingers to do this. Apply light pressure, then medium pressure, then firm pressure. The light pressure will allow you to feel the tissue closest to the skin. The medium pressure will allow you to feel the tissue that is a little deeper. The firm pressure will allow you to feel the tissue  close to the ribs.  Continue the overlapping circles, moving downward over the breast until you feel your ribs below your breast.  Move one finger-width toward the center of the body. Continue to use the  inch (2 cm) overlapping circles to feel your breast as you move slowly up toward your collarbone.  Continue the up and down exam using all three pressures until you reach your armpit.  Write Down What You Find  Write down what is normal for each breast and any changes that you find. Keep a written record with breast changes or normal findings for each breast. By writing this information down, you do not need to depend only on memory for size, tenderness, or location. Write down where you are in your menstrual cycle, if you are still menstruating. If you are having trouble noticing differences   in your breasts, do not get discouraged. With time you will become more familiar with the variations in your breasts and more comfortable with the exam. How often should I examine my breasts? Examine your breasts every month. If you are breastfeeding, the best time to examine your breasts is after a feeding or after using a breast pump. If you menstruate, the best time to examine your breasts is 5-7 days after your period is over. During your period, your breasts are lumpier, and it may be more difficult to notice changes. When should I see my health care provider? See your health care provider if you notice:  A change in shape or size of your breasts or nipples.  A change in the skin of your breast or nipples, such as a reddened or scaly area.  Unusual discharge from your nipples.  A lump or thick area that was not there before.  Pain in your breasts.  Anything that concerns you.  

## 2020-02-19 LAB — CYTOLOGY - PAP: Diagnosis: NEGATIVE

## 2020-04-07 DIAGNOSIS — M25331 Other instability, right wrist: Secondary | ICD-10-CM | POA: Insufficient documentation

## 2020-04-07 DIAGNOSIS — S66811A Strain of other specified muscles, fascia and tendons at wrist and hand level, right hand, initial encounter: Secondary | ICD-10-CM | POA: Insufficient documentation

## 2020-04-07 DIAGNOSIS — M1811 Unilateral primary osteoarthritis of first carpometacarpal joint, right hand: Secondary | ICD-10-CM | POA: Insufficient documentation

## 2020-11-24 DIAGNOSIS — M1711 Unilateral primary osteoarthritis, right knee: Secondary | ICD-10-CM | POA: Insufficient documentation

## 2020-11-24 DIAGNOSIS — L309 Dermatitis, unspecified: Secondary | ICD-10-CM | POA: Insufficient documentation

## 2020-11-24 DIAGNOSIS — D2330 Other benign neoplasm of skin of unspecified part of face: Secondary | ICD-10-CM | POA: Insufficient documentation

## 2021-02-23 ENCOUNTER — Ambulatory Visit: Payer: Medicare Other | Admitting: Obstetrics and Gynecology

## 2021-03-02 NOTE — Progress Notes (Signed)
70 y.o. G0P0000 Single White or Caucasian Not Hispanic or Latino female here for annual exam.  No vaginal bleeding. No bowel or bladder issues.   H/O DCIS left breast, 2009.  Infrequent GSI with valsalva and a full bladder. She has occasional urge incontinence with a full bladder. Gradually started in the last 1-2 years.   No bowel c/o.   Patient's last menstrual period was 04/08/2003 (approximate).          Sexually active: No.  The current method of family planning is post menopausal status.    Has a personal trainer, strength, balance, walking Exercising: Yes.     Smoker:  no  Health Maintenance: Pap:  02-18-20 normal History of abnormal Pap:  no MMG: 12/2020- WNL per pt  BMD:   12-16-18 normal  Colonoscopy: 02/2021- WNL per pt, last routine, only needs f/u if having problems.   TDaP:  2018 Gardasil: N/A   reports that she has never smoked. She has never used smokeless tobacco. She reports current alcohol use. She reports that she does not use drugs. Occasional ETOH. 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      No current facility-administered medications for this visit.    Family History  Problem Relation Age of Onset   Diabetes Mother    Heart disease Mother    Heart disease Father    Heart failure Father    Cancer Paternal Aunt        lung ca   Colon cancer Neg Hx     Review of Systems  Genitourinary:        Bladder Leakage (urge/stress incontinence)     Exam:   BP 118/78 (BP Location: Right Arm, Patient Position: Sitting, Cuff Size: Normal)   Pulse 71   Ht 5\' 5"  (1.651 m)   Wt 239 lb (108.4 kg)   LMP 04/08/2003 (Approximate)   SpO2 98%   BMI 39.77 kg/m   Weight change: @WEIGHTCHANGE @ Height:   Height: 5\' 5"  (165.1 cm)  Ht Readings from Last 3 Encounters:  03/11/21 5\' 5"  (1.651 m)  02/18/20 5\' 6"  (1.676 m)  02/10/19 5' 5.25" (1.657 m)    General appearance: alert, cooperative and appears stated age Head: Normocephalic, without obvious abnormality, atraumatic Neck: no adenopathy, supple, symmetrical, trachea midline and thyroid normal to inspection and palpation Lungs: clear to auscultation bilaterally Cardiovascular: regular rate and rhythm Breasts: normal appearance, no masses or tenderness Abdomen: soft, non-tender; non distended,  no masses,  no organomegaly Extremities: extremities normal, atraumatic, no cyanosis or edema Skin: Skin color, texture, turgor normal. No rashes or lesions Lymph nodes: Cervical, supraclavicular, and axillary nodes normal. No abnormal inguinal nodes palpated Neurologic: Grossly normal   Pelvic: External genitalia:  no lesions              Urethra:  normal appearing urethra with no masses, tenderness or lesions, small caruncle  Bartholins and Skenes: normal                 Vagina: normal appearing vagina with normal color and discharge, no lesions              Cervix: no lesions               Bimanual Exam:  Uterus:  normal size, contour, position, consistency, mobility, non-tender              Adnexa: no mass, fullness, tenderness               Rectovaginal: Confirms               Anus:  normal sphincter tone, no lesions  Lovena Le chaperoned for the exam.  1. Well woman exam Discussed breast self exam Discussed calcium and vit D intake Mammogram, colonoscopy, and DEXA UTD  2. Mixed incontinence - Urinalysis,Complete w/RFL Culture  3. BMI 39.0-39.9,adult Exercising, joined  [employer].

## 2021-03-11 ENCOUNTER — Other Ambulatory Visit: Payer: Self-pay

## 2021-03-11 ENCOUNTER — Ambulatory Visit (INDEPENDENT_AMBULATORY_CARE_PROVIDER_SITE_OTHER): Payer: Federal, State, Local not specified - PPO | Admitting: Obstetrics and Gynecology

## 2021-03-11 ENCOUNTER — Encounter: Payer: Self-pay | Admitting: Obstetrics and Gynecology

## 2021-03-11 VITALS — BP 118/78 | HR 71 | Ht 65.0 in | Wt 239.0 lb

## 2021-03-11 DIAGNOSIS — N3946 Mixed incontinence: Secondary | ICD-10-CM | POA: Diagnosis not present

## 2021-03-11 DIAGNOSIS — Z6839 Body mass index (BMI) 39.0-39.9, adult: Secondary | ICD-10-CM

## 2021-03-11 DIAGNOSIS — Z01419 Encounter for gynecological examination (general) (routine) without abnormal findings: Secondary | ICD-10-CM | POA: Diagnosis not present

## 2021-03-11 NOTE — Patient Instructions (Signed)
EXERCISE   We recommended that you start or continue a regular exercise program for good health. Physical activity is anything that gets your body moving, some is better than none. The CDC recommends 150 minutes per week of Moderate-Intensity Aerobic Activity and 2 or more days of Muscle Strengthening Activity.  Benefits of exercise are limitless: helps weight loss/weight maintenance, improves mood and energy, helps with depression and anxiety, improves sleep, tones and strengthens muscles, improves balance, improves bone density, protects from chronic conditions such as heart disease, high blood pressure and diabetes and so much more. To learn more visit: https://www.cdc.gov/physicalactivity/index.html  DIET: Good nutrition starts with a healthy diet of fruits, vegetables, whole grains, and lean protein sources. Drink plenty of water for hydration. Minimize empty calories, sodium, sweets. For more information about dietary recommendations visit: https://health.gov/our-work/nutrition-physical-activity/dietary-guidelines and https://www.myplate.gov/  ALCOHOL:  Women should limit their alcohol intake to no more than 7 drinks/beers/glasses of wine (combined, not each!) per week. Moderation of alcohol intake to this level decreases your risk of breast cancer and liver damage.  If you are concerned that you may have a problem, or your friends have told you they are concerned about your drinking, there are many resources to help. A well-known program that is free, effective, and available to all people all over the nation is Alcoholics Anonymous.  Check out this site to learn more: https://www.aa.org/   CALCIUM AND VITAMIN D:  Adequate intake of calcium and Vitamin D are recommended for bone health.  You should be getting between 1000-1200 mg of calcium and 800 units of Vitamin D daily between diet and supplements  PAP SMEARS:  Pap smears, to check for cervical cancer or precancers,  have traditionally been  done yearly, scientific advances have shown that most women can have pap smears less often.  However, every woman still should have a physical exam from her gynecologist every year. It will include a breast check, inspection of the vulva and vagina to check for abnormal growths or skin changes, a visual exam of the cervix, and then an exam to evaluate the size and shape of the uterus and ovaries. We will also provide age appropriate advice regarding health maintenance, like when you should have certain vaccines, screening for sexually transmitted diseases, bone density testing, colonoscopy, mammograms, etc.   MAMMOGRAMS:  All women over 40 years old should have a routine mammogram.   COLON CANCER SCREENING: Now recommend starting at age 45. At this time colonoscopy is not covered for routine screening until 50. There are take home tests that can be done between 45-49.   COLONOSCOPY:  Colonoscopy to screen for colon cancer is recommended for all women at age 50.  We know, you hate the idea of the prep.  We agree, BUT, having colon cancer and not knowing it is worse!!  Colon cancer so often starts as a polyp that can be seen and removed at colonscopy, which can quite literally save your life!  And if your first colonoscopy is normal and you have no family history of colon cancer, most women don't have to have it again for 10 years.  Once every ten years, you can do something that may end up saving your life, right?  We will be happy to help you get it scheduled when you are ready.  Be sure to check your insurance coverage so you understand how much it will cost.  It may be covered as a preventative service at no cost, but you should check   your particular policy.      Breast Self-Awareness Breast self-awareness means being familiar with how your breasts look and feel. It involves checking your breasts regularly and reporting any changes to your health care provider. Practicing breast self-awareness is  important. A change in your breasts can be a sign of a serious medical problem. Being familiar with how your breasts look and feel allows you to find any problems early, when treatment is more likely to be successful. All women should practice breast self-awareness, including women who have had breast implants. How to do a breast self-exam One way to learn what is normal for your breasts and whether your breasts are changing is to do a breast self-exam. To do a breast self-exam: Look for Changes  Remove all the clothing above your waist. Stand in front of a mirror in a room with good lighting. Put your hands on your hips. Push your hands firmly downward. Compare your breasts in the mirror. Look for differences between them (asymmetry), such as: Differences in shape. Differences in size. Puckers, dips, and bumps in one breast and not the other. Look at each breast for changes in your skin, such as: Redness. Scaly areas. Look for changes in your nipples, such as: Discharge. Bleeding. Dimpling. Redness. A change in position. Feel for Changes Carefully feel your breasts for lumps and changes. It is best to do this while lying on your back on the floor and again while sitting or standing in the shower or tub with soapy water on your skin. Feel each breast in the following way: Place the arm on the side of the breast you are examining above your head. Feel your breast with the other hand. Start in the nipple area and make  inch (2 cm) overlapping circles to feel your breast. Use the pads of your three middle fingers to do this. Apply light pressure, then medium pressure, then firm pressure. The light pressure will allow you to feel the tissue closest to the skin. The medium pressure will allow you to feel the tissue that is a little deeper. The firm pressure will allow you to feel the tissue close to the ribs. Continue the overlapping circles, moving downward over the breast until you feel your  ribs below your breast. Move one finger-width toward the center of the body. Continue to use the  inch (2 cm) overlapping circles to feel your breast as you move slowly up toward your collarbone. Continue the up and down exam using all three pressures until you reach your armpit.  Write Down What You Find  Write down what is normal for each breast and any changes that you find. Keep a written record with breast changes or normal findings for each breast. By writing this information down, you do not need to depend only on memory for size, tenderness, or location. Write down where you are in your menstrual cycle, if you are still menstruating. If you are having trouble noticing differences in your breasts, do not get discouraged. With time you will become more familiar with the variations in your breasts and more comfortable with the exam. How often should I examine my breasts? Examine your breasts every month. If you are breastfeeding, the best time to examine your breasts is after a feeding or after using a breast pump. If you menstruate, the best time to examine your breasts is 5-7 days after your period is over. During your period, your breasts are lumpier, and it may be more   difficult to notice changes. When should I see my health care provider? See your health care provider if you notice: A change in shape or size of your breasts or nipples. A change in the skin of your breast or nipples, such as a reddened or scaly area. Unusual discharge from your nipples. A lump or thick area that was not there before. Pain in your breasts. Anything that concerns you. Kegel Exercises Kegel exercises can help strengthen your pelvic floor muscles. The pelvic floor is a group of muscles that support your rectum, small intestine, and bladder. In females, pelvic floor muscles also help support the uterus. These muscles help you control the flow of urine and stool (feces). Kegel exercises are painless and  simple. They do not require any equipment. Your provider may suggest Kegel exercises to: Improve bladder and bowel control. Improve sexual response. Improve weak pelvic floor muscles after surgery to remove the uterus (hysterectomy) or after pregnancy, in females. Improve weak pelvic floor muscles after prostate gland removal or surgery, in males. Kegel exercises involve squeezing your pelvic floor muscles. These are the same muscles you squeeze when you try to stop the flow of urine or keep from passing gas. The exercises can be done while sitting, standing, or lying down, but it is best to vary your position. Ask your health care provider which exercises are safe for you. Do exercises exactly as told by your health care provider and adjust them as directed. Do not begin these exercises until told by your health care provider. Exercises How to do Kegel exercises: Squeeze your pelvic floor muscles tight. You should feel a tight lift in your rectal area. If you are a female, you should also feel a tightness in your vaginal area. Keep your stomach, buttocks, and legs relaxed. Hold the muscles tight for up to 10 seconds. Breathe normally. Relax your muscles for up to 10 seconds. Repeat as told by your health care provider. Repeat this exercise daily as told by your health care provider. Continue to do this exercise for at least 4-6 weeks, or for as long as told by your health care provider. You may be referred to a physical therapist who can help you learn more about how to do Kegel exercises. Depending on your condition, your health care provider may recommend: Varying how long you squeeze your muscles. Doing several sets of exercises every day. Doing exercises for several weeks. Making Kegel exercises a part of your regular exercise routine. This information is not intended to replace advice given to you by your health care provider. Make sure you discuss any questions you have with your health  care provider. Document Revised: 09/02/2020 Document Reviewed: 09/02/2020 Elsevier Patient Education  2022 [employer].

## 2021-03-13 LAB — URINALYSIS, COMPLETE W/RFL CULTURE
Bacteria, UA: NONE SEEN /HPF
Bilirubin Urine: NEGATIVE
Casts: NONE SEEN /LPF
Crystals: NONE SEEN /HPF
Glucose, UA: NEGATIVE
Hyaline Cast: NONE SEEN /LPF
Leukocyte Esterase: NEGATIVE
Nitrites, Initial: NEGATIVE
Protein, ur: NEGATIVE
Specific Gravity, Urine: 1.02 (ref 1.001–1.035)
Yeast: NONE SEEN /HPF
pH: 5.5 (ref 5.0–8.0)

## 2021-03-13 LAB — URINE CULTURE
MICRO NUMBER:: 12595625
Result:: NO GROWTH
SPECIMEN QUALITY:: ADEQUATE

## 2021-03-13 LAB — CULTURE INDICATED

## 2021-03-14 ENCOUNTER — Other Ambulatory Visit: Payer: Self-pay | Admitting: Obstetrics and Gynecology

## 2021-03-14 DIAGNOSIS — R3129 Other microscopic hematuria: Secondary | ICD-10-CM

## 2021-03-29 ENCOUNTER — Other Ambulatory Visit: Payer: Self-pay

## 2021-03-29 ENCOUNTER — Other Ambulatory Visit: Payer: Medicare Other

## 2021-03-29 DIAGNOSIS — R3129 Other microscopic hematuria: Secondary | ICD-10-CM

## 2021-03-29 LAB — URINALYSIS, MICROSCOPIC ONLY
Bacteria, UA: NONE SEEN /HPF
Casts: NONE SEEN /LPF
Crystals: NONE SEEN /HPF
Hyaline Cast: NONE SEEN /LPF
WBC, UA: NONE SEEN /HPF (ref 0–5)
Yeast: NONE SEEN /HPF

## 2021-03-30 ENCOUNTER — Telehealth: Payer: Self-pay

## 2021-03-30 NOTE — Telephone Encounter (Signed)
Per Dr. Talbert Nan result note "persistent low levels of blood in her urine and place a referral to Urology."   I called patient. Spoke with patient and informed her of result. She does note have urologist. I told her I will send referral to [facility] today and they will call her. I provided her with the phone number and let her know she can also call their office to schedule.  Referral faxed to [facility].

## 2021-04-15 ENCOUNTER — Ambulatory Visit: Payer: Medicare Other | Admitting: Obstetrics and Gynecology

## 2021-04-27 ENCOUNTER — Other Ambulatory Visit: Payer: Self-pay | Admitting: Urology

## 2021-04-27 ENCOUNTER — Other Ambulatory Visit (HOSPITAL_COMMUNITY): Payer: Self-pay | Admitting: Urology

## 2021-04-27 DIAGNOSIS — R319 Hematuria, unspecified: Secondary | ICD-10-CM

## 2021-05-03 NOTE — Telephone Encounter (Signed)
Patient was seen on 04/07/21

## 2021-05-11 ENCOUNTER — Ambulatory Visit (HOSPITAL_COMMUNITY)
Admission: RE | Admit: 2021-05-11 | Discharge: 2021-05-11 | Disposition: A | Discharge status: Home / Self Care | Payer: Federal, State, Local not specified - PPO | Source: Ambulatory Visit | Attending: Urology | Admitting: Urology

## 2021-05-11 ENCOUNTER — Other Ambulatory Visit: Payer: Self-pay

## 2021-05-11 DIAGNOSIS — R319 Hematuria, unspecified: Secondary | ICD-10-CM | POA: Diagnosis present

## 2021-05-11 MED ORDER — GADOBUTROL 1 MMOL/ML IV SOLN
10.0000 mL | Freq: Once | INTRAVENOUS | Status: AC | PRN
  Administered 2021-05-11: 10 mL via INTRAVENOUS

## 2021-12-27 ENCOUNTER — Encounter: Payer: Self-pay | Admitting: Obstetrics and Gynecology

## 2021-12-29 ENCOUNTER — Encounter: Payer: Self-pay | Admitting: Obstetrics and Gynecology

## 2022-03-07 NOTE — Progress Notes (Addendum)
71 y.o. G0P0000 Single White or Caucasian Not Hispanic or Latino female here for annual exam.  H/O DCIS left breast, 2009. No vaginal bleeding.    She saw Urology last year for microscopic hematuria.   She has mild urge incontinence with a full bladder, also GSI only with a full bladder. Can be small to moderate amounts. Tolerable.  No bowel c/o.   Patient's last menstrual period was 04/08/2003 (approximate).          Sexually active: No.  The current method of family planning is post menopausal status.    Exercising: Yes.     Water classes 5 days a week  Smoker:  no  Health Maintenance: Pap:  02-18-20 normal, 01/31/18 WNL Hr HPV neg  History of abnormal Pap:  no MMG:  12/29/21 Bi-rads 2 benign  BMD:   12/26/21  normal  Colonoscopy: 04/10/13  f/u 10 years  TDaP:  12/28/16 Gardasil: n/a   reports that she has never smoked. She has never used smokeless tobacco. She reports current alcohol use. She reports that she does not use drugs. 1 drink a week. 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      No current facility-administered medications for this visit.    Family History  Problem Relation Age of Onset   Diabetes Mother    Heart disease Mother    Heart disease Father    Heart failure Father    Cancer Paternal Aunt        lung ca   Colon cancer Neg Hx     Review of Systems  All other systems reviewed and are negative.   Exam:   BP 134/78   Pulse 88   Ht '5\' 5"'$   (1.651 m)   Wt 245 lb (111.1 kg)   LMP 04/08/2003 (Approximate)   SpO2 100%   BMI 40.77 kg/m   Weight change: '@WEIGHTCHANGE'$ @ Height:   Height: '5\' 5"'$  (165.1 cm)  Ht Readings from Last 3 Encounters:  03/14/22 '5\' 5"'$  (1.651 m)  03/11/21 '5\' 5"'$  (1.651 m)  02/18/20 '5\' 6"'$  (1.676 m)    General appearance: alert, cooperative and appears stated age Head: Normocephalic, without obvious abnormality, atraumatic Neck: no adenopathy, supple, symmetrical, trachea midline and thyroid normal to inspection and palpation Lungs: clear to auscultation bilaterally Cardiovascular: regular rate and rhythm Breasts: normal appearance, no masses or tenderness Abdomen: soft, non-tender; non distended,  no masses,  no organomegaly Extremities: extremities normal, atraumatic, no cyanosis or edema Skin: Skin color, texture, turgor normal. No rashes or lesions Lymph nodes: Cervical, supraclavicular, and axillary nodes normal. No abnormal inguinal nodes palpated Neurologic: Grossly normal   Pelvic: External genitalia:  no lesions              Urethra:  normal appearing urethra with no masses, tenderness or lesions              Bartholins  and Skenes: normal                 Vagina: normal appearing vagina with normal color and discharge, no lesions              Cervix: no lesions               Bimanual Exam:  Uterus:   no masses or tenderness              Adnexa: no mass, fullness, tenderness               Rectovaginal: Confirms               Anus:  normal sphincter tone, no lesions  Glorianne Manchester, RN chaperoned for the exam.  1. Well woman exam Discussed breast self exam Discussed calcium and vit D intake Mammogram, colonoscopy, and DEXA are UTD No pap this year  2. Mixed incontinence Stable and tolerable  3. Microscopic hematuria She was evaluated by Urology, will get a copy of the evaluation. She never got a final word that everything was fine. Urine dip in their office was negative and cystoscopy was  negative. Will get a copy of CT and notes.   Addendum: CT from 12/22 reviewed, no urologic abnormalities noted. Fibroid uterus and either broad ligament fibroid or adnexal mass noted.  MRI 05/11/21 Fibroid uterus including a pedunculated fibroid on the right c/w the mass reported on CT. Still need a copy of her Urology notes, will request them again.   Addendum: Urology notes reviewed, negative cystoscopy Urine was negative for review.

## 2022-03-14 ENCOUNTER — Ambulatory Visit (INDEPENDENT_AMBULATORY_CARE_PROVIDER_SITE_OTHER): Payer: Federal, State, Local not specified - PPO | Admitting: Obstetrics and Gynecology

## 2022-03-14 ENCOUNTER — Encounter: Payer: Self-pay | Admitting: Obstetrics and Gynecology

## 2022-03-14 VITALS — BP 134/78 | HR 88 | Ht 65.0 in | Wt 245.0 lb

## 2022-03-14 DIAGNOSIS — R3129 Other microscopic hematuria: Secondary | ICD-10-CM | POA: Diagnosis not present

## 2022-03-14 DIAGNOSIS — N3946 Mixed incontinence: Secondary | ICD-10-CM | POA: Diagnosis not present

## 2022-03-14 DIAGNOSIS — Z01419 Encounter for gynecological examination (general) (routine) without abnormal findings: Secondary | ICD-10-CM | POA: Diagnosis not present

## 2022-03-14 NOTE — Patient Instructions (Signed)

## 2022-03-21 ENCOUNTER — Telehealth: Payer: Self-pay | Admitting: Obstetrics and Gynecology

## 2022-03-21 NOTE — Telephone Encounter (Signed)
Mychart message sent.

## 2022-03-29 ENCOUNTER — Telehealth: Payer: Self-pay | Admitting: Obstetrics and Gynecology

## 2022-03-29 NOTE — Telephone Encounter (Signed)
Mychart message sent with information about her negative evaluation for microscopic hematuria.

## 2022-06-13 ENCOUNTER — Other Ambulatory Visit: Payer: Self-pay | Admitting: Physician Assistant

## 2022-06-13 DIAGNOSIS — Z136 Encounter for screening for cardiovascular disorders: Secondary | ICD-10-CM

## 2022-06-13 DIAGNOSIS — Z8249 Family history of ischemic heart disease and other diseases of the circulatory system: Secondary | ICD-10-CM

## 2022-06-26 ENCOUNTER — Ambulatory Visit
Admission: RE | Admit: 2022-06-26 | Discharge: 2022-06-26 | Disposition: A | Discharge status: Home / Self Care | Payer: Federal, State, Local not specified - PPO | Source: Ambulatory Visit | Attending: Physician Assistant | Admitting: Physician Assistant

## 2022-06-26 DIAGNOSIS — Z8249 Family history of ischemic heart disease and other diseases of the circulatory system: Secondary | ICD-10-CM

## 2022-06-26 DIAGNOSIS — Z136 Encounter for screening for cardiovascular disorders: Secondary | ICD-10-CM

## 2023-03-09 IMAGING — MR MR PELVIS WO/W CM
18 of 20 series · 44 of 48 positions shown · IV contrast (10 GADAVIST)
Comparison: CT abdomen and pelvis 04/21/2021

CLINICAL DATA: Pelvic mass.  Abnormal CT

EXAM:
MRI PELVIS WITHOUT AND WITH CONTRAST
TECHNIQUE: Multiplanar multisequence MR imaging of the pelvis was performed
both before and after administration of intravenous contrast.
CONTRAST:  10mL GADAVIST GADOBUTROL 1 MMOL/ML IV SOLN

[Series 2: T2 · coronal · 6.0mm · 1.56mm/px · 2 of 30 slices shown (1 of 4)]
[im 1/30]
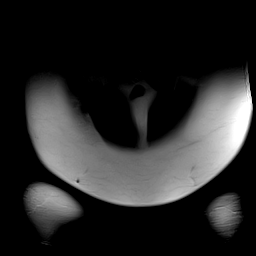
[im 30/30]
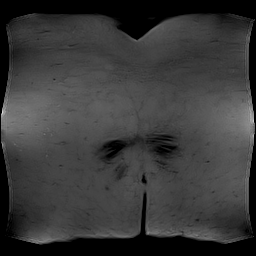

[Series 3: T2 · axial · 5.0mm · 0.51mm/px · 1 of 34 slices shown (2 of 4)]
[im 1/34]
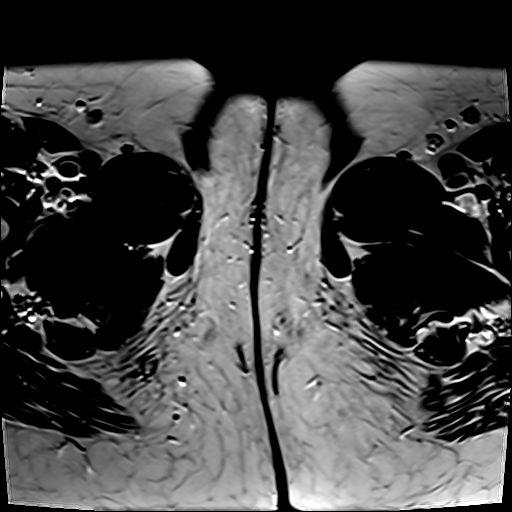

[Series 4: T2 · coronal · 4.0mm · 0.51mm/px · 2 of 46 slices shown (3 of 4)]
[im 1/46]
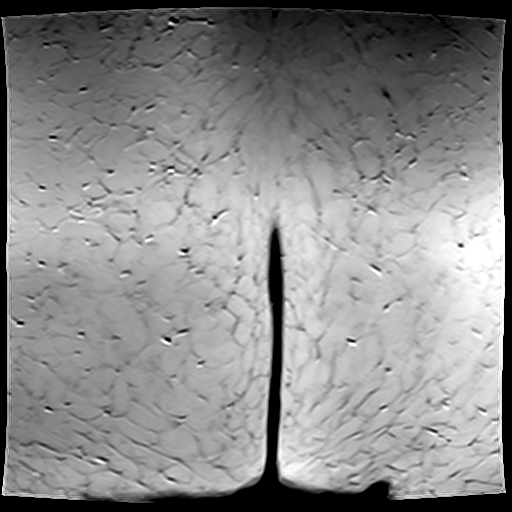
[im 46/46]
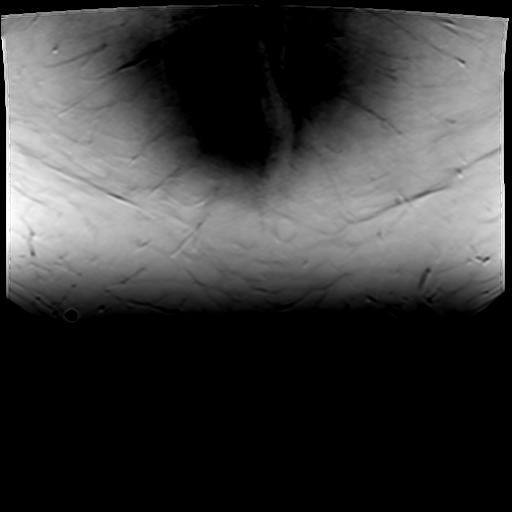

[Series 5: T2 fat-sat · axial · 5.0mm · 0.51mm/px · 1 of 40 slices shown]
[im 1/40]
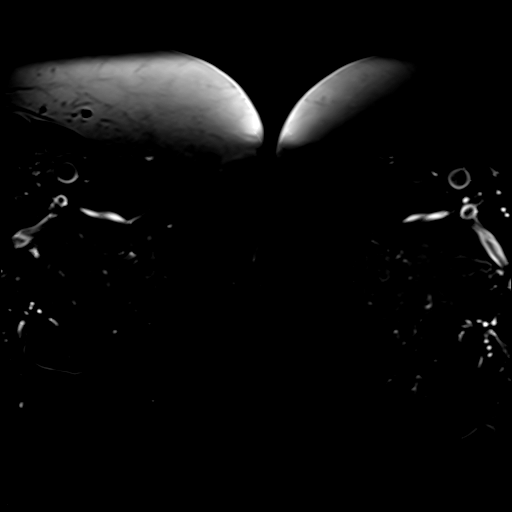

[Series 6: T2 · sagittal · 5.0mm · 0.55mm/px · 1 of 40 slices shown (4 of 4)]
[im 1/40]
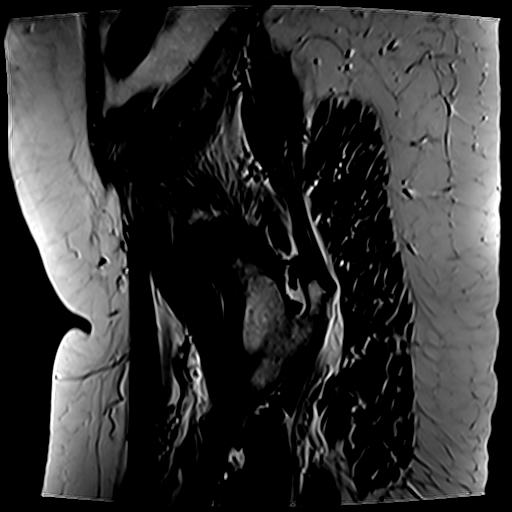

[Series 7: T1 · axial · 4.0mm · 0.84mm/px · z∈[-133,+151]mm · 3 of 72 slices shown (1 of 2)]
[im 1/72]
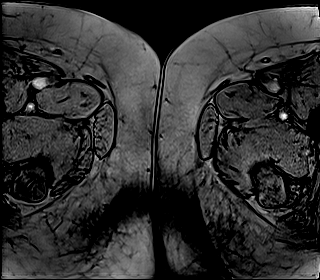
[im 36/72]
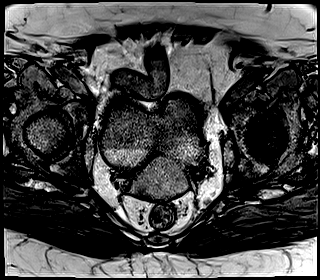
[im 72/72]
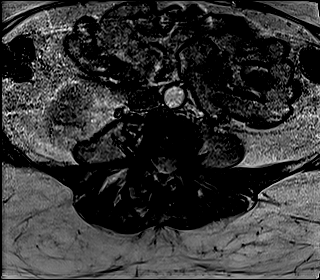

[Series 8: T1 · axial · 4.0mm · 0.84mm/px · z∈[-133,+151]mm · 3 of 72 slices shown (2 of 2)]
[im 1/72]
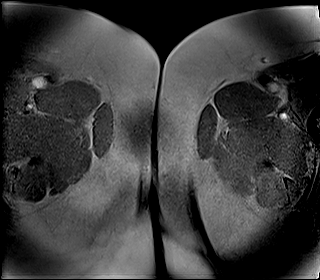
[im 36/72]
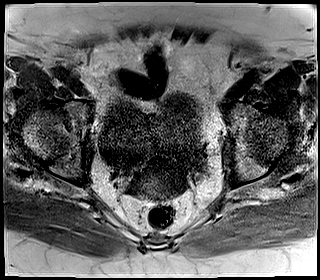
[im 72/72]
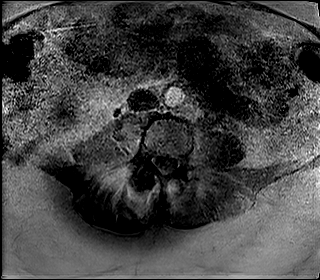

[Series 9: DWI · axial · 5.0mm · 2.80mm/px · z∈[-82,+113]mm · 4 of 118 slices shown (1 of 3)]
[im 1/118]
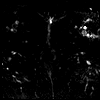
[im 40/118]
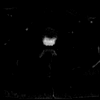
[im 79/118]
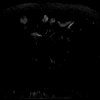
[im 118/118]
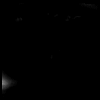

[Series 10: DWI · axial · 5.0mm · 2.80mm/px · 1 of 40 slices shown (2 of 3)]
[im 1/40]
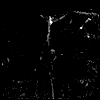

[Series 11: DWI · axial · 5.0mm · 2.80mm/px · 1 of 40 slices shown (3 of 3)]
[im 1/40]
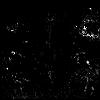

[Series 13: T1 dynamic · axial · 3.0mm · 0.84mm/px · z∈[-100,+137]mm · 3 of 80 slices shown (1 of 7)]
[im 1/80]
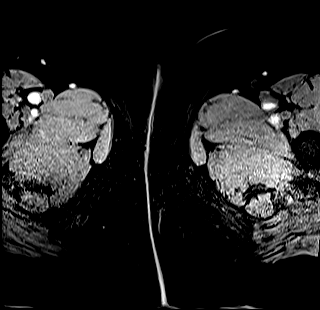
[im 40/80]
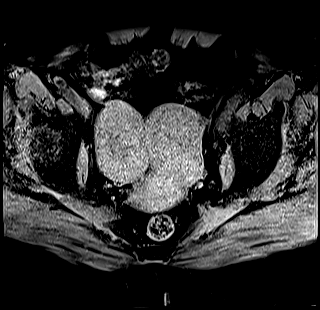
[im 80/80]
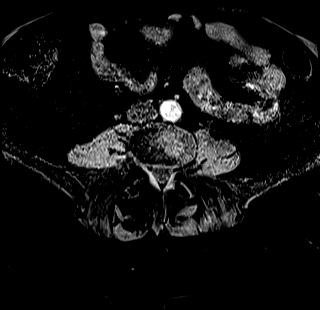

[Series 17: T1 dynamic · axial · 3.0mm · 0.84mm/px · z∈[-100,+137]mm · 3 of 80 slices shown (2 of 7)]
[im 1/80]
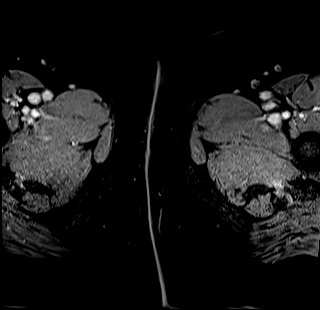
[im 40/80]
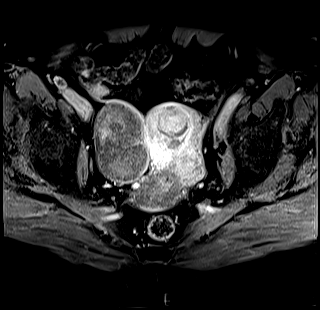
[im 80/80]
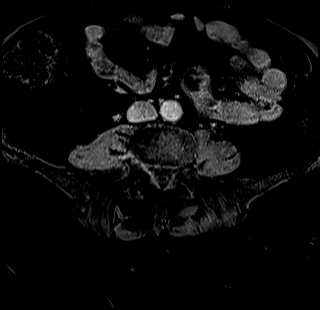

[Series 18: T1 dynamic · axial · 3.0mm · 0.84mm/px · z∈[-100,+137]mm · 3 of 80 slices shown (3 of 7)]
[im 1/80]
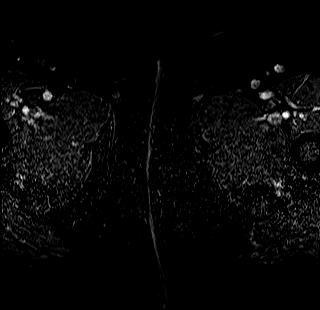
[im 40/80]
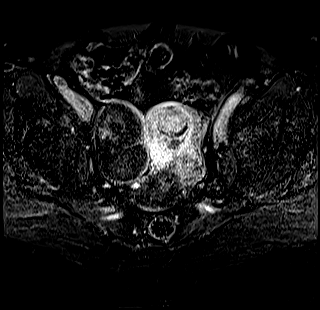
[im 80/80]
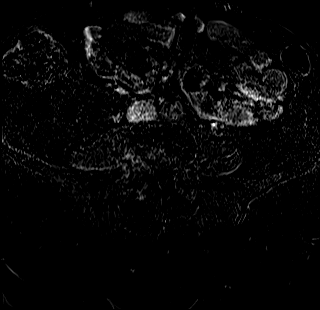

[Series 21: T1 dynamic · axial · 3.0mm · 0.84mm/px · z∈[-100,+137]mm · 3 of 80 slices shown (4 of 7)]
[im 1/80]
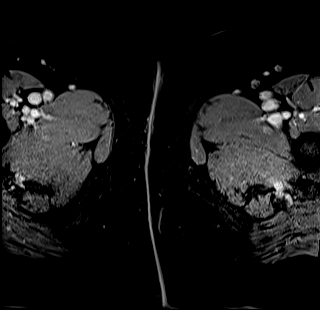
[im 40/80]
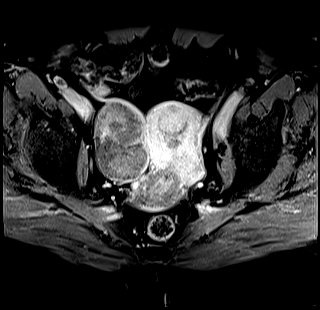
[im 80/80]
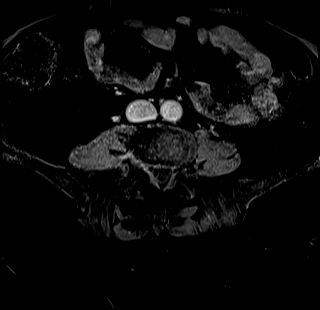

[Series 22: T1 dynamic · axial · 3.0mm · 0.84mm/px · z∈[-100,+137]mm · 3 of 80 slices shown (5 of 7)]
[im 1/80]
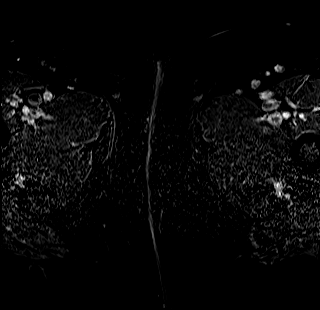
[im 40/80]
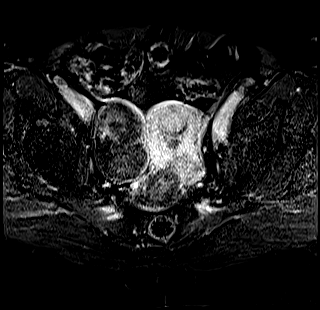
[im 80/80]
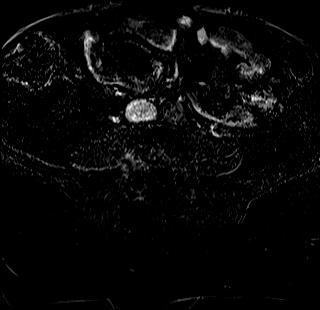

[Series 25: T1 dynamic · axial · 3.0mm · 0.84mm/px · z∈[-100,+137]mm · 3 of 80 slices shown (6 of 7)]
[im 1/80]
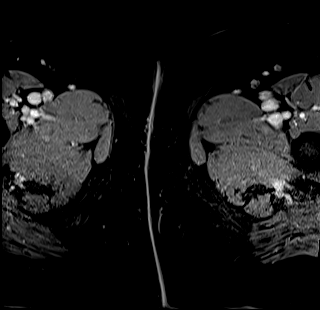
[im 40/80]
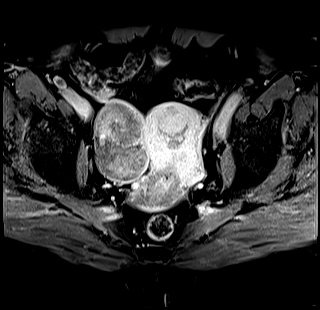
[im 80/80]
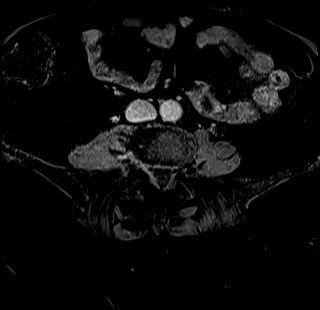

[Series 26: T1 dynamic · axial · 3.0mm · 0.84mm/px · z∈[-100,+137]mm · 3 of 80 slices shown (7 of 7)]
[im 1/80]
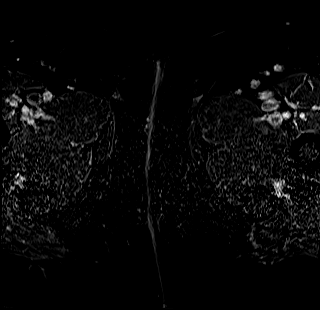
[im 40/80]
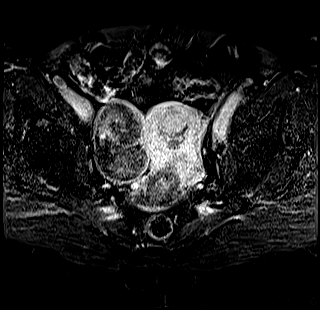
[im 80/80]
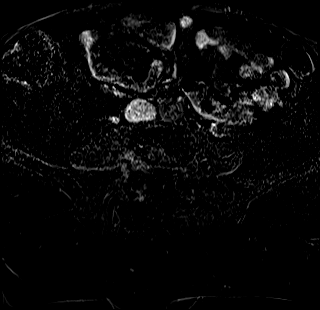

[Series 28: T1 dynamic post-contrast · axial · 3.0mm · 1.06mm/px · z∈[-116,+169]mm · 4 of 96 slices shown]
[im 1/96]
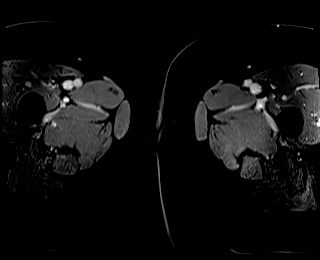
[im 32/96]
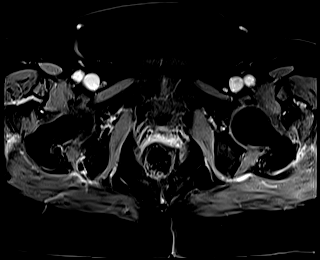
[im 64/96]
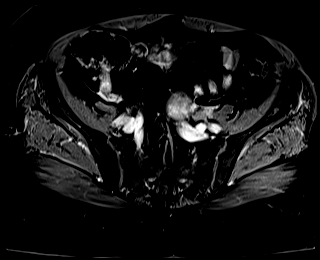
[im 96/96]
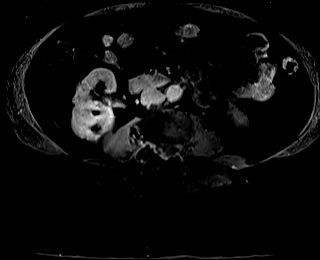

[44 of 48 positions shown; findings below may reference images not displayed]

FINDINGS: Urinary Tract: Urinary bladder is incompletely distended and not
well evaluated, no obvious mass visualized. Partially visualized
kidneys do not demonstrate hydronephrosis.

Bowel:  No evidence of bowel obstruction.

Vascular/Lymphatic: No pathologically enlarged lymph nodes. No
significant vascular abnormality seen.

Reproductive: Uterus measures 4.8 x 4.3 x 8.9 cm and is deviated to
the left of midline. Myometrium is diffusely heterogeneous with
multiple round mostly hypointense T2 signal masses which are
consistent with fibroids. Largest fibroid is pedunculated on the
right measuring 4.3 x 6.9 x 4.7 cm. Some additional fibroids include
4 x 3.7 cm exophytic subserosal fibroid at the posterior fundus,
cm submucosal fibroid at the fundus and 3.1 cm exophytic subserosal
fibroid on the left. Endometrium appears otherwise normal thickness
with no suspicious mass lesions identified. Cervix appears within
normal limits. Vaginal canal is unremarkable. Left ovary appears
normal. Right ovary not definitely visualized.

Other:  Trace free fluid in the pelvis.

Musculoskeletal: No suspicious bony lesions visualized.
IMPRESSION: 1. Fibroid uterus including a pedunculated fibroid on the right
consistent with the mass reported on CT.
2. Trace free fluid in the pelvis.

## 2023-03-28 ENCOUNTER — Encounter: Payer: Self-pay | Admitting: Internal Medicine

## 2023-04-18 ENCOUNTER — Ambulatory Visit: Payer: Medicare Other | Admitting: Obstetrics and Gynecology

## 2023-09-06 ENCOUNTER — Other Ambulatory Visit (HOSPITAL_COMMUNITY)
Admission: RE | Admit: 2023-09-06 | Discharge: 2023-09-06 | Disposition: A | Discharge status: Home / Self Care | Source: Ambulatory Visit | Attending: Obstetrics and Gynecology | Admitting: Obstetrics and Gynecology

## 2023-09-06 ENCOUNTER — Encounter: Payer: Self-pay | Admitting: Obstetrics and Gynecology

## 2023-09-06 ENCOUNTER — Ambulatory Visit (INDEPENDENT_AMBULATORY_CARE_PROVIDER_SITE_OTHER): Admitting: Obstetrics and Gynecology

## 2023-09-06 VITALS — BP 118/74 | HR 77 | Temp 98.3°F | Ht 65.5 in | Wt 250.0 lb

## 2023-09-06 DIAGNOSIS — Z124 Encounter for screening for malignant neoplasm of cervix: Secondary | ICD-10-CM

## 2023-09-06 DIAGNOSIS — Z01419 Encounter for gynecological examination (general) (routine) without abnormal findings: Secondary | ICD-10-CM | POA: Diagnosis not present

## 2023-09-06 DIAGNOSIS — Z1331 Encounter for screening for depression: Secondary | ICD-10-CM

## 2023-09-06 DIAGNOSIS — R3915 Urgency of urination: Secondary | ICD-10-CM

## 2023-09-06 DIAGNOSIS — N3946 Mixed incontinence: Secondary | ICD-10-CM | POA: Diagnosis not present

## 2023-09-06 NOTE — Patient Instructions (Signed)

## 2023-09-06 NOTE — Assessment & Plan Note (Addendum)
 Cervical cancer screening performed according to ASCCP guidelines, would like to continue screening at this time Encouraged annual mammogram screening Colonoscopy UTD, completed DXA UTD, ordered by PCP Labs and immunizations with her primary Encouraged safe sexual practices as indicated Encouraged healthy lifestyle practices with diet and exercise For patients over 73yo, I recommend 1200mg  calcium daily and 800IU of vitamin D daily.

## 2023-09-06 NOTE — Assessment & Plan Note (Addendum)
 Patient with OAB symptoms. Education on cause provided. Discussed lifestyle modifications for management, including avoidance of fluid prior to bed and bladder irritants. Also discussion PFPT and medical managements (including use of myrbetriq, gemtesa (okay with HTN), trospium, oxybutynin). Patient desires kegels and exp management.

## 2023-09-06 NOTE — Progress Notes (Addendum)
 73 y.o. G0P0000 female with history of left breast cancer (diagnosed 2009, DCIS s/p lumpectomy, RT) here for annual exam. Single.  Retired from [employer]  Has kittens.  Patient's last menstrual period was 04/08/2003 (approximate).   Started on hypertensive and HDL medications over the past year. Urinary urgency and leakage. Bladder control has been a concern- worsened over las couple of years. Leakage with a full bladder. 6 voids per day, nocturia 1 times per night.  Light leakage on panty liner occasionally. Unsure if she is doing Kegels correctly.  Does admit frequently.  2023 referred to urology for microscopic hematuria.  Workup was negative.  Abnormal bleeding: none Pelvic discharge or pain: none Breast mass, nipple discharge or skin changes : none  Last PAP:     Component Value Date/Time   DIAGPAP  02/18/2020 1013    - Negative for intraepithelial lesion or malignancy (NILM)   DIAGPAP  01/31/2018 0000    NEGATIVE FOR INTRAEPITHELIAL LESIONS OR MALIGNANCY.   ADEQPAP  02/18/2020 1013    Satisfactory for evaluation; transformation zone component PRESENT.   ADEQPAP  01/31/2018 0000    Satisfactory for evaluation. The presence or absence of an endocervical / transformation zone component cannot be determined because of atrophy.   Last mammogram: 12/29/2021 BIRAD2, benign, scheduled Aug 2025 Last colonoscopy: Completed within last 3yr, no longer indicated, per patient DXA: 12/27/21: normal, scheduled Aug 2025  Sexually active: no  Exercising: Gym/ health club routine includes water aerobics, 4 days/week Smoker: no  Garment/textile technologist Visit from 09/06/2023 in [facility]  PHQ-2 Total Score 0        GYN HISTORY: History of left breast cancer  OB History  Gravida Para Term Preterm AB Living  0 0 0 0 0 0  SAB IAB Ectopic Multiple Live Births  0 0 0 0 0    Past Medical History:  Diagnosis Date   Arthritis    Carpal tunnel  syndrome    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     losartan (COZAAR) 50 MG tablet Take 1 tablet by mouth daily.     Multiple Vitamin (MULTIVITAMIN PO) Take 1 capsule by mouth.      rosuvastatin (CRESTOR) 10 MG tablet Take 10 mg by mouth.     No current facility-administered medications on file prior to visit.    Social History   Socioeconomic History   Marital status: Single    Spouse name: Not on file   Number of children: 0   Years of education: Not on file   Highest education level: Not on file  Occupational History   Not on file  Tobacco Use   Smoking status: Never   Smokeless tobacco: Never  Vaping Use   Vaping status: Never Used  Substance and Sexual Activity   Alcohol use: Yes    Alcohol/week: 0.0 - 1.0 standard drinks of alcohol    Comment: occasionally   Drug use: No   Sexual activity: Not Currently  Partners: Male    Birth control/protection: Post-menopausal  Other Topics Concern   Not on file  Social History Narrative   Not on file   Social Drivers of Health   Financial Resource Strain: Not on file  Food Insecurity: Low Risk  (07/12/2023)   Received from [facility]   Hunger Vital Sign    Worried About Running Out of Food in the Last Year: Never true    Ran Out of Food in the Last Year: Never true  Transportation Needs: No Transportation Needs (07/12/2023)   Received from [facility]    In the past 12 months, has lack of reliable transportation kept you from medical appointments, meetings, work or from getting things  needed for daily living? : No  Physical Activity: Not on file  Stress: Not on file  Social Connections: Not on file  Intimate Partner Violence: Not on file    Family History  Problem Relation Age of Onset   Diabetes Mother    Heart disease Mother    Heart disease Father    Heart failure Father    Cancer Paternal Aunt        lung ca   Colon cancer Neg Hx     No Known Allergies    PE Today's Vitals   09/06/23 1105  BP: 118/74  Pulse: 77  Temp: 98.3 F (36.8 C)  TempSrc: Oral  SpO2: 96%  Weight: 250 lb (113.4 kg)  Height: 5' 5.5" (1.664 m)   Body mass index is 40.97 kg/m.  Physical Exam Vitals reviewed. Exam conducted with a chaperone present.  Constitutional:      General: She is not in acute distress.    Appearance: Normal appearance.  HENT:     Head: Normocephalic and atraumatic.     Nose: Nose normal.  Eyes:     Extraocular Movements: Extraocular movements intact.     Conjunctiva/sclera: Conjunctivae normal.  Neck:     Thyroid: No thyroid mass, thyromegaly or thyroid tenderness.  Pulmonary:     Effort: Pulmonary effort is normal.  Chest:     Chest wall: No mass or tenderness.  Breasts:    Right: Normal. No swelling, mass, nipple discharge, skin change or tenderness.     Left: Normal. No swelling, mass, nipple discharge, skin change or tenderness.  Abdominal:     General: There is no distension.     Palpations: Abdomen is soft.     Tenderness: There is no abdominal tenderness.  Genitourinary:    General: Normal vulva.     Exam position: Lithotomy position.     Urethra: No prolapse.     Vagina: Normal. No vaginal discharge or bleeding.     Cervix: Normal. No lesion.     Uterus: Normal. Not enlarged and not tender.      Adnexa: Right adnexa normal and left adnexa normal.     Comments: Kegel 3 out of 5 Musculoskeletal:        General: Normal range of motion.     Cervical back: Normal range of motion.  Lymphadenopathy:     Upper Body:     Right  upper body: No axillary adenopathy.     Left upper body: No axillary adenopathy.     Lower Body: No right inguinal adenopathy. No left inguinal adenopathy.  Skin:    General: Skin is warm and dry.  Neurological:     General: No focal deficit present.     Mental Status: She  is alert.  Psychiatric:        Mood and Affect: Mood normal.        Behavior: Behavior normal.      Assessment and Plan:        Well woman exam with routine gynecological exam Assessment & Plan: Cervical cancer screening performed according to ASCCP guidelines, would like to continue screening at this time Encouraged annual mammogram screening Colonoscopy UTD, completed DXA UTD, ordered by PCP Labs and immunizations with her primary Encouraged safe sexual practices as indicated Encouraged healthy lifestyle practices with diet and exercise For patients over 70yo, I recommend 1200mg  calcium daily and 800IU of vitamin D daily.    Urgency of urination -     Urinalysis,Complete w/RFL Culture  Mixed incontinence Assessment & Plan: Patient with OAB symptoms. Education on cause provided. Discussed lifestyle modifications for management, including avoidance of fluid prior to bed and bladder irritants. Also discussion PFPT and medical managements (including use of myrbetriq, gemtesa (okay with HTN), trospium, oxybutynin). Patient desires kegels and exp management.     Negative depression screening  Cervical cancer screening -     Cytology - PAP  Other orders -     Urine Culture -     REFLEXIVE URINE CULTURE   Romaine Closs, MD

## 2023-09-06 NOTE — Addendum Note (Signed)
 Addended by: Cleone Dad V on: 09/06/2023 04:02 PM   Modules accepted: Orders

## 2023-09-08 LAB — URINALYSIS, COMPLETE W/RFL CULTURE
Bilirubin Urine: NEGATIVE
Glucose, UA: NEGATIVE
Hyaline Cast: NONE SEEN /LPF
Ketones, ur: NEGATIVE
Leukocyte Esterase: NEGATIVE
Nitrites, Initial: NEGATIVE
Protein, ur: NEGATIVE
Specific Gravity, Urine: 1.015 (ref 1.001–1.035)
pH: 8.5 — ABNORMAL HIGH (ref 5.0–8.0)

## 2023-09-08 LAB — URINE CULTURE
MICRO NUMBER:: 16400598
Result:: NO GROWTH
SPECIMEN QUALITY:: ADEQUATE

## 2023-09-08 LAB — CULTURE INDICATED

## 2023-09-10 ENCOUNTER — Encounter: Payer: Self-pay | Admitting: Obstetrics and Gynecology

## 2023-09-17 LAB — CYTOLOGY - PAP: Diagnosis: NEGATIVE

## 2023-09-18 ENCOUNTER — Encounter: Payer: Self-pay | Admitting: Obstetrics and Gynecology

## 2023-11-21 ENCOUNTER — Other Ambulatory Visit: Payer: Self-pay

## 2023-11-22 ENCOUNTER — Telehealth: Payer: Self-pay | Admitting: *Deleted

## 2023-11-22 LAB — SURGICAL PATHOLOGY

## 2023-11-22 NOTE — Telephone Encounter (Signed)
 Spoke to patient to confirm upcoming morning [facility] clinic appointment on 7/23, paperwork will be sent via [facility].  Gave location and time, also informed patient that the surgeon's office would be calling as well to get information from them similar to the packet that they will be receiving so make sure to do both.  Reminded patient that all providers will be coming to the clinic to see them HERE and if they had any questions to not hesitate to reach back out to myself or their navigators.

## 2023-11-26 ENCOUNTER — Other Ambulatory Visit: Payer: Self-pay | Admitting: *Deleted

## 2023-11-26 ENCOUNTER — Encounter: Payer: Self-pay | Admitting: *Deleted

## 2023-11-26 DIAGNOSIS — C50812 Malignant neoplasm of overlapping sites of left female breast: Secondary | ICD-10-CM

## 2023-11-27 ENCOUNTER — Telehealth (HOSPITAL_COMMUNITY): Payer: Self-pay

## 2023-11-27 DIAGNOSIS — C50812 Malignant neoplasm of overlapping sites of left female breast: Secondary | ICD-10-CM | POA: Insufficient documentation

## 2023-11-27 NOTE — Progress Notes (Signed)
 Radiation Oncology         [phone] ________________________________  Multidisciplinary Breast Oncology Clinic [facility]) Initial Outpatient Consultation  Name: Bonnie Collins MRN: [id number]  Date: 11/28/2023  DOB: 1950/12/03  RR:Tpodnw, Prentice DEL, MD  Vernetta Berg, MD   REFERRING PHYSICIAN: Vernetta Berg, MD  DIAGNOSIS: There were no encounter diagnoses.  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  HISTORY OF PRESENT ILLNESS::Bonnie Collins is a 73 y.o. female who is presenting to the office today for evaluation of her newly diagnosed breast cancer. She is accompanied by ***. She is doing well overall.   {She had routine screening mammography on *** showing a possible abnormality in the *** breast.} She underwent bilateral diagnostic mammography with tomography and *** breast ultrasonography at {[facility]} on *** showing: ***.  Biopsies of the 12 o'clock left breast (4cmfn) and 4 o'clock retroareolar left breast both showed grade 2 invasive lobular carcinoma measuring 1.4 cm and 0.9 cm in the greatest linear extent of the samples respectively. Prognostic indicators significant for: estrogen receptor, 95% positive and progesterone receptor, 50% positive, both with strong staining intensity. Proliferation marker Ki67 at 20%. HER2 negative. No lymph nodes were examined.   Menarche: *** years old Age at first live birth: *** years old GP: *** LMP: *** Contraceptive: *** HRT: ***   The patient was referred today for presentation in the multidisciplinary conference.  Radiology studies and pathology slides were presented there for review and  discussion of treatment options.  A consensus was discussed regarding potential next steps.  PREVIOUS RADIATION THERAPY: Yes   History of ER/PR+ left breast intermediate grade DCIS diagnosed in 2009, s/p left breast lumpectomy on 12/10/2007 followed by adjuvant radiation therapy (under the care of Dr. 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; s/p lumpectomy; s/p adj XRT; on adj Tamoxifen    Sinus problem     PAST SURGICAL HISTORY: Past Surgical History:  Procedure Laterality Date   BREAST LUMPECTOMY Left 2009   CARPAL TUNNEL RELEASE Bilateral 1993 & 2003   TONSILLECTOMY  age 4     FAMILY HISTORY:  Family History  Problem Relation Age of Onset   Diabetes Mother    Heart disease Mother    Heart disease Father    Heart failure Father    Cancer Paternal Aunt        lung ca   Colon cancer Neg Hx     SOCIAL HISTORY:  Social History   Socioeconomic History   Marital status: Single    Spouse name: Not on file   Number of children: 0   Years of education: Not on file   Highest education level: Not on file  Occupational History   Not on file  Tobacco Use   Smoking status: Never   Smokeless tobacco: Never  Vaping Use   Vaping status: Never Used  Substance and Sexual Activity   Alcohol use: Yes    Alcohol/week: 0.0 -  1.0 standard drinks of alcohol    Comment: occasionally   Drug use: No   Sexual activity: Not Currently    Partners: Male    Birth control/protection: Post-menopausal  Other Topics Concern   Not on file  Social History Narrative   Not on file   Social Drivers of Health   Financial Resource Strain: Not on file  Food Insecurity: Low Risk  (07/12/2023)   Received from [facility]   Hunger Vital Sign    Within the past 12 months, you worried that  your food would run out before you got money to buy more: Never true    Within the past 12 months, the food you bought just didn't last and you didn't have money to get more. : Never true  Transportation Needs: No Transportation Needs (07/12/2023)   Received from [facility]    In the past 12 months, has lack of reliable transportation kept you from medical appointments, meetings, work or from getting things needed for daily living? : No  Physical Activity: Not on file  Stress: Not on file  Social Connections: Not on file    ALLERGIES: No Known Allergies  MEDICATIONS:  Current Outpatient Medications  Medication Sig Dispense Refill   cholecalciferol (VITAMIN D3) 25 MCG (1000 UNIT) tablet Take 1,000 Units by mouth.     losartan (COZAAR) 50 MG tablet Take 1 tablet by mouth daily.     Multiple Vitamin (MULTIVITAMIN PO) Take 1 capsule by mouth.      rosuvastatin (CRESTOR) 10 MG tablet Take 10 mg by mouth.     No current facility-administered medications for this encounter.    REVIEW OF SYSTEMS: A 10+ POINT REVIEW OF SYSTEMS WAS OBTAINED including neurology, dermatology, psychiatry, cardiac, respiratory, lymph, extremities, GI, GU, musculoskeletal, constitutional, reproductive, HEENT. On the provided form, she reports ***. She denies *** and any other symptoms.    PHYSICAL EXAM:  vitals were not taken for this visit.  {may need to copy over vitals} Lungs are clear to auscultation bilaterally. Heart has regular rate and rhythm. No palpable cervical, supraclavicular, or axillary adenopathy. Abdomen soft, non-tender, normal bowel sounds. Breast: Right breast with no palpable mass, nipple discharge, or bleeding. Left breast with ***.   KPS = ***  100 - Normal; no complaints; no evidence of disease. 90   - Able to carry on normal activity; minor signs or symptoms of disease. 80   - Normal activity with effort; some signs or symptoms of disease. 62   - Cares for self; unable  to carry on normal activity or to do active work. 60   - Requires occasional assistance, but is able to care for most of his personal needs. 50   - Requires considerable assistance and frequent medical care. 40   - Disabled; requires special care and assistance. 30   - Severely disabled; hospital admission is indicated although death not imminent. 20   - Very sick; hospital admission necessary; active supportive treatment necessary. 10   - Moribund; fatal processes progressing rapidly. 0     - Dead  Karnofsky DA, Abelmann WH, Craver LS and Burchenal [facility] [phone]) The use of the nitrogen mustards in the palliative treatment of carcinoma: with particular reference to bronchogenic carcinoma Cancer 1 634-56  LABORATORY DATA:  Lab Results  Component Value Date   WBC 4.3 11/20/2013   HGB 14.7 01/12/2015   HCT 42.2 11/20/2013   MCV 87.9 11/20/2013   PLT 201 11/20/2013  Lab Results  Component Value Date   NA 144 11/20/2013   K 4.0 11/20/2013   CL 107 03/20/2012   CO2 28 11/20/2013   Lab Results  Component Value Date   ALT 13 11/20/2013   AST 16 11/20/2013   ALKPHOS 72 11/20/2013   BILITOT 0.35 11/20/2013    PULMONARY FUNCTION TEST:   Review Flowsheet        No data to display          RADIOGRAPHY: No results found.    IMPRESSION: ***   Patient will be a good candidate for breast conservation with radiotherapy to the left breast. We discussed the general course of radiation, potential side effects, and toxicities with radiation and the patient is interested in this approach. ***   PLAN:  ***    ------------------------------------------------  Lynwood CHARM Nasuti, PhD, MD  This document serves as a record of services personally performed by Lynwood Nasuti, MD. It was created on his behalf by Dorthy Fuse, a trained medical scribe. The creation of this record is based on the scribe's personal observations and the provider's statements to them. This document has been checked  and approved by the attending provider.

## 2023-11-27 NOTE — Telephone Encounter (Signed)
 Patient Confirmed appointment.

## 2023-11-28 ENCOUNTER — Encounter: Payer: Self-pay | Admitting: Physical Therapy

## 2023-11-28 ENCOUNTER — Encounter: Payer: Self-pay | Admitting: Genetic Counselor

## 2023-11-28 ENCOUNTER — Ambulatory Visit: Attending: Surgery | Admitting: Physical Therapy

## 2023-11-28 ENCOUNTER — Inpatient Hospital Stay: Attending: Internal Medicine

## 2023-11-28 ENCOUNTER — Inpatient Hospital Stay: Admitting: Licensed Clinical Social Worker

## 2023-11-28 ENCOUNTER — Encounter: Payer: Self-pay | Admitting: Family Medicine

## 2023-11-28 ENCOUNTER — Encounter: Payer: Self-pay | Admitting: *Deleted

## 2023-11-28 ENCOUNTER — Other Ambulatory Visit: Payer: Self-pay

## 2023-11-28 ENCOUNTER — Inpatient Hospital Stay: Admitting: Genetic Counselor

## 2023-11-28 ENCOUNTER — Ambulatory Visit
Admission: RE | Admit: 2023-11-28 | Discharge: 2023-11-28 | Disposition: A | Discharge status: Home / Self Care | Source: Ambulatory Visit | Attending: Radiation Oncology | Admitting: Radiation Oncology

## 2023-11-28 ENCOUNTER — Inpatient Hospital Stay: Admitting: Hematology and Oncology

## 2023-11-28 ENCOUNTER — Other Ambulatory Visit: Payer: Self-pay | Admitting: Surgery

## 2023-11-28 VITALS — BP 153/70 | HR 73 | Temp 98.2°F | Resp 17 | Wt 251.1 lb

## 2023-11-28 DIAGNOSIS — Z801 Family history of malignant neoplasm of trachea, bronchus and lung: Secondary | ICD-10-CM | POA: Insufficient documentation

## 2023-11-28 DIAGNOSIS — I10 Essential (primary) hypertension: Secondary | ICD-10-CM | POA: Insufficient documentation

## 2023-11-28 DIAGNOSIS — C50412 Malignant neoplasm of upper-outer quadrant of left female breast: Secondary | ICD-10-CM | POA: Insufficient documentation

## 2023-11-28 DIAGNOSIS — E7800 Pure hypercholesterolemia, unspecified: Secondary | ICD-10-CM | POA: Insufficient documentation

## 2023-11-28 DIAGNOSIS — Z9089 Acquired absence of other organs: Secondary | ICD-10-CM

## 2023-11-28 DIAGNOSIS — Z170 Estrogen receptor positive status [ER+]: Secondary | ICD-10-CM | POA: Insufficient documentation

## 2023-11-28 DIAGNOSIS — Z1732 Human epidermal growth factor receptor 2 negative status: Secondary | ICD-10-CM | POA: Diagnosis not present

## 2023-11-28 DIAGNOSIS — Z8249 Family history of ischemic heart disease and other diseases of the circulatory system: Secondary | ICD-10-CM | POA: Diagnosis not present

## 2023-11-28 DIAGNOSIS — Z833 Family history of diabetes mellitus: Secondary | ICD-10-CM | POA: Diagnosis not present

## 2023-11-28 DIAGNOSIS — Z8042 Family history of malignant neoplasm of prostate: Secondary | ICD-10-CM

## 2023-11-28 DIAGNOSIS — Z79899 Other long term (current) drug therapy: Secondary | ICD-10-CM | POA: Diagnosis not present

## 2023-11-28 DIAGNOSIS — C50812 Malignant neoplasm of overlapping sites of left female breast: Secondary | ICD-10-CM | POA: Insufficient documentation

## 2023-11-28 DIAGNOSIS — Z1721 Progesterone receptor positive status: Secondary | ICD-10-CM | POA: Diagnosis not present

## 2023-11-28 DIAGNOSIS — R293 Abnormal posture: Secondary | ICD-10-CM | POA: Insufficient documentation

## 2023-11-28 DIAGNOSIS — N6489 Other specified disorders of breast: Secondary | ICD-10-CM

## 2023-11-28 DIAGNOSIS — Z853 Personal history of malignant neoplasm of breast: Secondary | ICD-10-CM

## 2023-11-28 LAB — CMP (CANCER CENTER ONLY)
ALT: 10 U/L (ref 0–44)
AST: 13 U/L — ABNORMAL LOW (ref 15–41)
Albumin: 4.1 g/dL (ref 3.5–5.0)
Alkaline Phosphatase: 73 U/L (ref 38–126)
Anion gap: 4 — ABNORMAL LOW (ref 5–15)
BUN: 11 mg/dL (ref 8–23)
CO2: 31 mmol/L (ref 22–32)
Calcium: 9.4 mg/dL (ref 8.9–10.3)
Chloride: 106 mmol/L (ref 98–111)
Creatinine: 0.86 mg/dL (ref 0.44–1.00)
GFR, Estimated: >60 mL/min (ref >60)
Glucose, Bld: 102 mg/dL — ABNORMAL HIGH (ref 70–99)
Potassium: 4.8 mmol/L (ref 3.5–5.1)
Sodium: 141 mmol/L (ref 135–145)
Total Bilirubin: 0.5 mg/dL (ref 0.0–1.2)
Total Protein: 7.2 g/dL (ref 6.5–8.1)

## 2023-11-28 LAB — CBC WITH DIFFERENTIAL (CANCER CENTER ONLY)
Abs Immature Granulocytes: 0.01 K/uL (ref 0.00–0.07)
Basophils Absolute: 0 K/uL (ref 0.0–0.1)
Basophils Relative: 0 %
Eosinophils Absolute: 0.1 K/uL (ref 0.0–0.5)
Eosinophils Relative: 2 %
HCT: 43.1 % (ref 36.0–46.0)
Hemoglobin: 13.8 g/dL (ref 12.0–15.0)
Immature Granulocytes: 0 %
Lymphocytes Relative: 28 %
Lymphs Abs: 1.4 K/uL (ref 0.7–4.0)
MCH: 28.5 pg (ref 26.0–34.0)
MCHC: 32 g/dL (ref 30.0–36.0)
MCV: 89 fL (ref 80.0–100.0)
Monocytes Absolute: 0.4 K/uL (ref 0.1–1.0)
Monocytes Relative: 8 %
Neutro Abs: 3.2 K/uL (ref 1.7–7.7)
Neutrophils Relative %: 62 %
Platelet Count: 193 K/uL (ref 150–400)
RBC: 4.84 MIL/uL (ref 3.87–5.11)
RDW: 13.8 % (ref 11.5–15.5)
WBC Count: 5.2 K/uL (ref 4.0–10.5)
nRBC: 0 % (ref 0.0–0.2)

## 2023-11-28 LAB — GENETIC SCREENING ORDER

## 2023-11-28 NOTE — Progress Notes (Signed)
 [facility] Clinical Social Work  Initial Assessment   Bonnie Collins is a 73 y.o. year old female presenting alone. Clinical Social Work was referred by [facility] for assessment of psychosocial needs.   SDOH (Social Determinants of Health) assessments performed: Yes SDOH Interventions    Flowsheet Row Clinical Support from 11/28/2023 in [facility] A Dept Of [facility]  SDOH Interventions   Food Insecurity Interventions Intervention Not Indicated  Housing Interventions Intervention Not Indicated  Transportation Interventions Intervention Not Indicated  Utilities Interventions Intervention Not Indicated    SDOH Screenings   Food Insecurity: No Food Insecurity (11/28/2023)  Housing: Low Risk  (11/28/2023)  Transportation Needs: No Transportation Needs (11/28/2023)  Utilities: Not At Risk (11/28/2023)  Depression (PHQ2-9): Low Risk  (11/28/2023)  Tobacco Use: Low Risk  (11/28/2023)   Received from [facility]     Distress Screen completed: No     No data to display            Family/Social Information:  Housing Arrangement: patient lives alone Family members/support persons in your life? Family- 3 siblings within 3 hours drive, 10 nieces & nephews;  many different friend groups locally Transportation concerns: no  Employment: Retired.  Income source: Actor concerns: No Type of concern: None Food access concerns: no Religious or spiritual practice: Not known Advanced directives: Yes-not on file Services Currently in place:  [employer]  Coping/ Adjustment to diagnosis: Patient understands treatment plan and what happens next? yes, feels better after meeting with the medicalteam. She had DCIS in 2009 and now will have a mastectomy, oncotype, and AI Patient reported stressors: Adjusting to my illness Patient enjoys exercise, time with family/ friends, and pottery Current coping skills/ strengths: Ability  for insight , Capable of independent living , Manufacturing systems engineer , Motivation for treatment/growth , Special hobby/interest , and Supportive family/friends     SUMMARY: Current SDOH Barriers:  No major barriers identified today  Clinical Social Work Clinical Goal(s):  No clinical social work goals at this time  Interventions: Discussed common feeling and emotions when being diagnosed with cancer, and the importance of support during treatment Informed patient of the support team roles and support services at [facility] Provided CSW contact information and encouraged patient to call with any questions or concerns   Follow Up Plan: Patient will contact CSW with any support or resource needs Patient verbalizes understanding of plan: Yes    Shannara Winbush E Latisha Lasch, LCSW Clinical Social Worker [facility]

## 2023-11-28 NOTE — Assessment & Plan Note (Signed)
 Bonnie Collins

## 2023-11-28 NOTE — Research (Signed)
 [id number]- Effectiveness of Out-of Pocket Cost Communication and Financial Navigation (CostCOM) in Cancer Patients:    Patient Bonnie Collins was identified by Dr. Loretha as a potential candidate for the above listed study.  This Clinical Research Coordinator met with Bonnie Collins, [id number], on 11/28/23 in a manner and location that ensures patient privacy to discuss participation in the above listed research study.  Patient is Unaccompanied.  A copy of the informed consent document and separate HIPAA Authorization was provided to the patient.  Patient reads, speaks, and understands [place].   Patient was provided with the business card of this Coordinator and encouraged to contact the research team with any questions.  Approximately 10 minutes were spent with the patient reviewing the informed consent documents.  Patient was provided the option of taking informed consent documents home to review and was encouraged to review at their convenience with their support network, including other care providers. Patient is comfortable with making a decision regarding study participation today. Patient declined study participation. Patient mentioned study requires more time , not interested. Dr. Loretha notified.

## 2023-11-28 NOTE — Progress Notes (Signed)
 [facility] CONSULT NOTE  Patient Care Team: Tanda Prentice DEL, MD as PCP - General (Family Medicine) Lonn Hicks, MD as Consulting Physician (Hematology and Oncology) Jannis Kate Norris, MD as Consulting Physician (Obstetrics and Gynecology) Tyree Nanetta SAILOR, RN as Oncology Nurse Navigator Gerome, Devere HERO, RN as Oncology Nurse Navigator Loretha Ash, MD as Consulting Physician (Hematology and Oncology) Shannon Agent, MD as Consulting Physician (Radiation Oncology) Vernetta Berg, MD as Consulting Physician (General Surgery)  CHIEF COMPLAINTS/PURPOSE OF CONSULTATION:  Newly diagnosed breast cancer  HISTORY OF PRESENTING ILLNESS:  Bonnie Collins 73 y.o. female is here because of recent diagnosis of left ILC.  I reviewed her records extensively and collaborated the history with the patient.  SUMMARY OF ONCOLOGIC HISTORY: Oncology History  Malignant neoplasm of overlapping sites of left breast in female, estrogen receptor positive (HCC)  11/13/2023 Mammogram   Mammogram with asymmetric increase in fibroglandular tissue in left breast which is indeterminate.  No sonographic correlate or abnormality to account for the asymmetric increase in the glandular tissue in the left breast.  Given this can be a subtle presentation of invasive lobular carcinoma contrast-enhanced mammogram is recommended  Contrast-enhanced imaging was successful and without incident this showed scattered non-mass enhancement throughout the left breast, anterior to middle depth.   11/21/2023 Pathology Results   Left breast needle core biopsy at 12:00 4 cm from the nipple showed overall grade 2 invasive mammary carcinoma. Left breast needle core biopsy retroareolar at 4:00 showed overall grade 2 invasive mammary carcinoma prognostic showed ER 95% positive strong staining intensity PR 50% positive staining intensity HER2 0 and Ki-67 of 20%   11/27/2023 Initial Diagnosis   Malignant neoplasm of  overlapping sites of left breast in female, estrogen receptor positive (HCC)   11/28/2023 Cancer Staging   Staging form: Breast, AJCC 8th Edition - Clinical stage from 11/28/2023: Stage IB (cT2, cN0, cM0, G2, ER+, PR+, HER2-) - Signed by Loretha Ash, MD on 11/28/2023 Stage prefix: Initial diagnosis Histologic grading system: 3 grade system Laterality: Left Staged by: Pathologist and managing physician Stage used in treatment planning: Yes National guidelines used in treatment planning: Yes Type of national guideline used in treatment planning: NCCN    Discussed the use of AI scribe software for clinical note transcription with the patient, who gave verbal consent to proceed.  History of Present Illness Bonnie Collins is a 73 year old female with a history of breast cancer who presents with a new breast mass.  She discovered a lump in her left breast on July 3rd, 2025. Imaging, including a mammogram, ultrasound, and contrast-enhanced mammogram, revealed a suspicious area with non-mass enhancement. Biopsies confirmed invasive lobular cancer in two areas: at the twelve o'clock position and behind the areola.  She has a history of breast cancer treated with lumpectomy and radiation in the same site, without a lymph node biopsy at that time. She previously took tamoxifen  for five years as part of her treatment.  Her current medications include treatment for high blood pressure, high cholesterol, vitamin D, and a multivitamin. She denies taking any blood thinners.  In her family history, there is no breast or ovarian cancer, but there is lung cancer in a paternal aunt.  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. She does not smoke and rarely drinks alcohol. She began menstruating at age 68 and ceased in 2004, never having taken hormones. She resides in [place].  She noted a new dimpling  in the breast after feeling the lump. No other changes  noted.  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; s/p lumpectomy; s/p adj XRT; on adj Tamoxifen    Hypertension    Sinus problem     SURGICAL HISTORY: Past Surgical History:  Procedure Laterality Date   BREAST LUMPECTOMY Left 2009   CARPAL TUNNEL RELEASE Bilateral 1993 & 2003   TONSILLECTOMY  age 27     SOCIAL HISTORY: Social History   Socioeconomic History   Marital status: Single    Spouse name: Not on file   Number of children: 0   Years of education: Not on file   Highest education level: Not on file  Occupational History   Not on file  Tobacco Use   Smoking status: Never   Smokeless tobacco: Never  Vaping Use   Vaping status: Never Used  Substance and Sexual Activity   Alcohol use: Yes    Alcohol/week: 0.0 - 1.0 standard drinks of alcohol    Comment: occasionally   Drug use: No   Sexual activity: Not Currently    Partners: Male    Birth control/protection: Post-menopausal  Other Topics Concern   Not on file  Social History Narrative   Not on file   Social Drivers of Health   Financial Resource Strain: Not on file  Food Insecurity: Low Risk  (07/12/2023)   Received from [facility]   Hunger Vital Sign    Within the past 12 months, you worried that your food would run out before you got money to buy more: Never true    Within the past 12 months, the food you bought just didn't last and you didn't have money to get more. : Never true  Transportation Needs: No Transportation Needs (07/12/2023)   Received from [facility]    In the past 12 months, has lack of reliable transportation kept you from medical appointments, meetings, work or from getting things needed for daily living? : No  Physical Activity: Not on file  Stress: Not on file  Social Connections: Not on file  Intimate Partner Violence: Not on file    FAMILY  HISTORY: Family History  Problem Relation Age of Onset   Diabetes Mother    Heart disease Mother    Heart disease Father    Heart failure Father    Cancer Paternal Aunt        lung ca   Colon cancer Neg Hx     ALLERGIES:  has no known allergies.  MEDICATIONS:  Current Outpatient Medications  Medication Sig Dispense Refill   losartan (COZAAR) 50 MG tablet Take 1 tablet by mouth daily.     Multiple Vitamin (MULTIVITAMIN PO) Take 1 capsule by mouth.      rosuvastatin (CRESTOR) 10 MG tablet Take 10 mg by mouth.     cholecalciferol (VITAMIN D3) 25 MCG (1000 UNIT) tablet Take 1,000 Units by mouth.     No current facility-administered medications for this visit.    REVIEW OF SYSTEMS:   Constitutional: Denies fevers, chills or abnormal night sweats Eyes: Denies blurriness of vision, double vision or watery eyes Ears, nose, mouth, throat, and face: Denies mucositis or sore throat Respiratory: Denies cough, dyspnea or wheezes Cardiovascular: Denies palpitation, chest discomfort or lower extremity swelling Gastrointestinal:  Denies nausea, heartburn or change in bowel habits Skin:  Denies abnormal skin rashes Lymphatics: Denies new lymphadenopathy or easy bruising Neurological:Denies numbness, tingling or new weaknesses Behavioral/Psych: Mood is stable, no new changes  Breast: Denies any palpable lumps or discharge All other systems were reviewed with the patient and are negative.  PHYSICAL EXAMINATION: ECOG PERFORMANCE STATUS: 0 - Asymptomatic  Vitals:   11/28/23 0913  BP: (!) 153/70  Pulse: 73  Resp: 17  Temp: 98.2 F (36.8 C)  SpO2: 94%   Filed Weights   11/28/23 0913  Weight: 251 lb 1.6 oz (113.9 kg)    GENERAL:alert, no distress and comfortable Left breast with small palpable nodule, some dimpling, previous surgical scar. No obvious large palpable mass or regional adenopathy CTA bilaterally NO LE edema.  LABORATORY DATA:  I have reviewed the data as listed Lab  Results  Component Value Date   WBC 4.3 11/20/2013   HGB 14.7 01/12/2015   HCT 42.2 11/20/2013   MCV 87.9 11/20/2013   PLT 201 11/20/2013   Lab Results  Component Value Date   NA 144 11/20/2013   K 4.0 11/20/2013   CL 107 03/20/2012   CO2 28 11/20/2013    RADIOGRAPHIC STUDIES: I have personally reviewed the radiological reports and agreed with the findings in the report.  ASSESSMENT AND PLAN:   Assessment and Plan Assessment & Plan Invasive lobular carcinoma of left breast Invasive lobular carcinoma confirmed in the left breast, grade 2, with a proliferation index of 20%. Tumor size indeterminate, with non-mass enhancement suggesting 5-8 cm. Estrogen and progesterone receptor-positive, Her 2 neg stage 1B. - Proceed with surgery, potentially a mastectomy. - Perform sentinel lymph node biopsy during surgery. - Conduct Oncotype testing post-surgery. Discussed the following details about oncotype. We have discussed about Oncotype Dx score which is a well validated prognostic scoring system which can predict outcome with endocrine therapy alone and whether chemotherapy reduces recurrence.  Typically in patients with ER positive cancers that are node negative if the RS score is high typically greater than or equal to 26, chemotherapy is recommended.  - Initiate anti-estrogen therapy with anastrozole or letrozole post-surgery for 5-7 years if oncotype low. Discussed about anti estrogen therapy in detail. With regards to Tamoxifen , we discussed that this is a SERM, selective estrogen receptor modulator. We discussed mechanism of action of Tamoxifen , adverse effects on Tamoxifen  including but not limited to post menopausal symptoms, increased risk of DVT/PE, increased risk of endometrial cancer, questionable cataracts with long term use and increased risk of cardiovascular events in the study which was not statistically significant. A benefit from Tamoxifen  would be improvement in bone  density. With regards to aromatase inhibitors, we discussed mechanism of action, adverse effects including but not limited to post menopausal symptoms, arthralgias, myalgias, increased risk of cardiovascular events and bone loss.   Breast cancer Previous breast cancer at the same site, treated with lumpectomy and radiation. Current cancer is invasive.  - Coordinate follow-up post-surgery to review outcomes and Oncotype results.   All questions were answered. The patient knows to call the clinic with any problems, questions or concerns.    Amber Stalls, MD 11/28/23

## 2023-11-28 NOTE — Research (Signed)
 [employer] 2021-05 - Specimen Collection Study to Evaluate Biomarkers in Subjects with Cancer    Patient Bonnie Collins was identified by Dr. Loretha as a potential candidate for the above listed study.  This Clinical Research Coordinator met with Bonnie Collins, [id number], on 11/28/23 in a manner and location that ensures patient privacy to discuss participation in the above listed research study.  Patient is Unaccompanied.  A copy of the informed consent document with embedded HIPAA language was provided to the patient.  Patient reads, speaks, and understands [place].   Patient was provided with the business card of this Coordinator and encouraged to contact the research team with any questions.  Approximately 10 minutes were spent with the patient reviewing the informed consent documents.  Patient was provided the option of taking informed consent documents home to review and was encouraged to review at their convenience with their support network, including other care providers. Patient took the consent documents home to review. Patient seems potentially interested.  Laury Quale, MPH  Clinical Research Coordinator

## 2023-11-28 NOTE — Progress Notes (Signed)
 REFERRING PROVIDER: Loretha Ash, MD  PRIMARY PROVIDER:  Tanda Prentice DEL, MD  PRIMARY REASON FOR VISIT:  1. Ductal carcinoma in situ (DCIS) of left breast   2. History of breast cancer   3. Family history of prostate cancer    HISTORY OF PRESENT ILLNESS:   Bonnie Collins, a 73 y.o. female, was seen for a [place] cancer genetics consultation at the request of Dr. Loretha due to a personal and family history of cancer.  Bonnie Collins presents to clinic today to discuss the possibility of a hereditary predisposition to cancer, to discuss genetic testing, and to further clarify her future cancer risks, as well as potential cancer risks for family members.   In July 2025, at the age of 87, Bonnie Collins was diagnosed with invasive lobular carcinoma of the left breast (ER/PR positive, HER2 negative). She has a history of left breast DCIS diagnosed in 2009 ~age 31.   CANCER HISTORY:  Oncology History  Malignant neoplasm of overlapping sites of left breast in female, estrogen receptor positive (HCC)  11/13/2023 Mammogram   Mammogram with asymmetric increase in fibroglandular tissue in left breast which is indeterminate.  No sonographic correlate or abnormality to account for the asymmetric increase in the glandular tissue in the left breast.  Given this can be a subtle presentation of invasive lobular carcinoma contrast-enhanced mammogram is recommended  Contrast-enhanced imaging was successful and without incident this showed scattered non-mass enhancement throughout the left breast, anterior to middle depth.   11/21/2023 Pathology Results   Left breast needle core biopsy at 12:00 4 cm from the nipple showed overall grade 2 invasive mammary carcinoma. Left breast needle core biopsy retroareolar at 4:00 showed overall grade 2 invasive mammary carcinoma prognostic showed ER 95% positive strong staining intensity PR 50% positive staining intensity HER2 0 and Ki-67 of 20%   11/27/2023 Initial Diagnosis    Malignant neoplasm of overlapping sites of left breast in female, estrogen receptor positive (HCC)   11/28/2023 Cancer Staging   Staging form: Breast, AJCC 8th Edition - Clinical stage from 11/28/2023: Stage IB (cT2, cN0, cM0, G2, ER+, PR+, HER2-) - Signed by Loretha Ash, MD on 11/28/2023 Stage prefix: Initial diagnosis Histologic grading system: 3 grade system Laterality: Left Staged by: Pathologist and managing physician Stage used in treatment planning: Yes National guidelines used in treatment planning: Yes Type of national guideline used in treatment planning: NCCN     Past Medical History:  Diagnosis Date   Arthritis    Carpal tunnel syndrome    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; s/p lumpectomy; s/p adj XRT; on adj Tamoxifen    Hypertension    Sinus problem     Past Surgical History:  Procedure Laterality Date   BREAST LUMPECTOMY Left 2009   CARPAL TUNNEL RELEASE Bilateral 1993 & 2003   TONSILLECTOMY  age 69     Social History   Socioeconomic History   Marital status: Single    Spouse name: Not on file   Number of children: 0   Years of education: Not on file   Highest education level: Not on file  Occupational History   Not on file  Tobacco Use   Smoking status: Never   Smokeless tobacco: Never  Vaping Use   Vaping status: Never Used  Substance and Sexual Activity   Alcohol use: Yes    Alcohol/week: 0.0 - 1.0 standard drinks of alcohol    Comment: occasionally   Drug use: No  Sexual activity: Not Currently    Partners: Male    Birth control/protection: Post-menopausal  Other Topics Concern   Not on file  Social History Narrative   Not on file   Social Drivers of Health   Financial Resource Strain: Not on file  Food Insecurity: No Food Insecurity (11/28/2023)   Hunger Vital Sign    Worried About Running Out of Food in the Last Year: Never true    Ran Out of Food in the Last Year: Never true  Transportation Needs: No  Transportation Needs (11/28/2023)   PRAPARE - Administrator, Civil Service (Medical): No    Lack of Transportation (Non-Medical): No  Physical Activity: Not on file  Stress: Not on file  Social Connections: Not on file     FAMILY HISTORY:  We obtained a detailed, 4-generation family history.  Significant diagnoses are listed below: Family History  Problem Relation Age of Onset   Diabetes Mother    Heart disease Mother    Heart disease Father    Heart failure Father    Prostate cancer Brother 40   Lung cancer Paternal Aunt 31       smoked   Multiple myeloma Paternal Uncle    Prostate cancer Maternal Cousin    Colon cancer Neg Hx      Bonnie Collins is unaware of previous family history of genetic testing for hereditary cancer risks. There is no reported Ashkenazi Jewish ancestry.   GENETIC COUNSELING ASSESSMENT: Bonnie Collins is a 73 y.o. female with a personal and family history of cancer which is somewhat suggestive of a hereditary predisposition to cancer given her history of two primary breast cancers and family history of prostate cancer. We, therefore, discussed and recommended the following at today's visit.   DISCUSSION: We discussed that 5 - 10% of cancer is hereditary, with most cases of breast cancer associated with BRCA1/2.  There are other genes that can be associated with hereditary breast cancer syndromes.  We discussed that testing is beneficial for several reasons including knowing how to follow individuals after completing their treatment, identifying whether potential treatment options would be beneficial, and understanding if other family members could be at risk for cancer and allowing them to undergo genetic testing.   We reviewed the characteristics, features and inheritance patterns of hereditary cancer syndromes. We also discussed genetic testing, including the appropriate family members to test, the process of testing, insurance coverage and  turn-around-time for results. We discussed the implications of a negative, positive, carrier and/or variant of uncertain significant result. We recommended Bonnie Collins pursue genetic testing for a panel that includes genes associated with breast and prostate cancer.   Bonnie Collins elected to have [employer]. The [facility] CancerNext+RNAinsight Panel includes sequencing, rearrangement analysis, and RNA analysis for the following 40 genes: APC, ATM, BAP1, BARD1, BMPR1A, BRCA1, BRCA2, BRIP1, CDH1, CDKN2A, CHEK2, FH, FLCN, MET, MLH1, MSH2, MSH6, MUTYH, NF1, NTHL1, PALB2, PMS2, PTEN, RAD51C, RAD51D, RPS20, SMAD4, STK11, TP53, TSC1, TSC2, and VHL (sequencing and deletion/duplication); AXIN2, HOXB13, MBD4, MSH3, POLD1 and POLE (sequencing only); EPCAM and GREM1 (deletion/duplication only).   Based on Bonnie Collins's personal and family history of cancer, she meets medical criteria for genetic testing. We ordered [facility]AT[facility] testing which is no cost genetic testing.  PLAN: After considering the risks, benefits, and limitations, Bonnie Collins provided informed consent to pursue genetic testing and the blood sample was sent to [facility] for analysis of the CancerNext Panel. Results should be  available within approximately 2-3 weeks' time, at which point they will be disclosed by telephone to Bonnie Collins, as will any additional recommendations warranted by these results. Bonnie Collins will receive a summary of her genetic counseling visit and a copy of her results once available. This information will also be available in Epic.   Bonnie Collins's questions were answered to her satisfaction today. Our contact information was provided should additional questions or concerns arise. Thank you for the referral and allowing us  to share in the care of your patient.   Elysha Daw, MS, [facility] Genetic Counselor [email or web address] (P) [phone]  40 minutes were spent on the date of the encounter in service  to the patient including preparation, face-to-face consultation, documentation and care coordination.  The patient brought her friend. Drs. Gudena and/or Lanny were available to discuss this case as needed.   _______________________________________________________________________ For Office Staff:  Number of people involved in session: 2 Was an Intern/ student involved with case: no

## 2023-11-28 NOTE — Therapy (Signed)
 OUTPATIENT PHYSICAL THERAPY BREAST CANCER BASELINE EVALUATION   Patient Name: Bonnie Collins MRN: [id number] DOB:1951/01/24, 73 y.o., female Today's Date: 11/28/2023  END OF SESSION:  PT End of Session - 11/28/23 1030     Visit Number 1    Number of Visits 2    Date for PT Re-Evaluation 01/23/24    PT Start Time 0947    PT Stop Time 1019    PT Time Calculation (min) 32 min    Activity Tolerance Patient tolerated treatment well    Behavior During Therapy [facility] for tasks assessed/performed          Past Medical History:  Diagnosis Date   Arthritis    Carpal tunnel syndrome    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; s/p lumpectomy; s/p adj XRT; on adj Tamoxifen    Hypertension    Sinus problem    Past Surgical History:  Procedure Laterality Date   BREAST LUMPECTOMY Left 2009   CARPAL TUNNEL RELEASE Bilateral 1993 & 2003   TONSILLECTOMY  age 73    Patient Active Problem List   Diagnosis Date Noted   Malignant neoplasm of overlapping sites of left breast in female, estrogen receptor positive (HCC) 11/27/2023   Well woman exam with routine gynecological exam 09/06/2023   Mixed incontinence 09/06/2023   Arthritis of right knee 11/24/2020   Benign neoplasm of skin of face 11/24/2020   Dermatitis 11/24/2020   Primary osteoarthritis of first carpometacarpal joint of right hand 04/07/2020   Rupture of extensor tendon of right hand 04/07/2020   Scapholunate dissociation, right 04/07/2020   Chronic pain of right knee 12/10/2019   Pure hypercholesterolemia 05/14/2018   Hallux valgus 11/25/2015   Reactive airway disease 10/18/2015   Obesity 10/18/2015   Inflamed seborrheic keratosis 10/18/2015   History of breast cancer 10/18/2015   DCIS (ductal carcinoma in situ)     REFERRING PROVIDER: Dr. Vicenta Poli  REFERRING DIAG: Left breast cancer  THERAPY DIAG:  Malignant neoplasm of upper-outer quadrant of left breast in female, estrogen receptor  positive (HCC)  Abnormal posture  Rationale for Evaluation and Treatment: Rehabilitation  ONSET DATE: 11/13/2023  SUBJECTIVE:                                                                                                                                                                                           SUBJECTIVE STATEMENT: Patient reports she is here today to be seen by her medical team for her newly diagnosed left breast cancer.   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  It measures 8 cm and is located in the upper outer quadrant. It is ER/PR positive and HER2 negative with a Ki67 of 20%. She has a history of left breast cancer DCIS in 2009 treated with a lumpectomy and radiation.  PATIENT GOALS:   reduce lymphedema risk and learn post op HEP.   PAIN:  Are you having pain? No  PRECAUTIONS: Active CA   RED FLAGS: None   HAND DOMINANCE: right  WEIGHT BEARING RESTRICTIONS: No  FALLS:  Has patient fallen in last 6 months? No  LIVING ENVIRONMENT: Patient lives with: alone Lives in: House/apartment Has following equipment at home: None  OCCUPATION: retired  LEISURE: She does water aerobics 4x/week  PRIOR LEVEL OF FUNCTION: Independent   OBJECTIVE: Note: Objective measures were completed at Evaluation unless otherwise noted.  COGNITION: Overall cognitive status: Within functional limits for tasks assessed    POSTURE:  Forward head and rounded shoulders posture  UPPER EXTREMITY AROM/PROM:  A/PROM RIGHT   eval   Shoulder extension 44  Shoulder flexion 148  Shoulder abduction 162  Shoulder internal rotation 63  Shoulder external rotation 78    (Blank rows = not tested)  A/PROM LEFT   eval  Shoulder extension 39  Shoulder flexion 137  Shoulder abduction 165  Shoulder internal rotation 63  Shoulder external rotation 73    (Blank rows = not tested)  CERVICAL AROM: All within  normal limits:    Percent limited  Flexion WNL  Extension WNL  Right lateral flexion 25% limited  Left lateral flexion 25% limited  Right rotation 25% limited  Left rotation 25% limited    UPPER EXTREMITY STRENGTH: WFL  LYMPHEDEMA ASSESSMENTS (in cm):   LANDMARK RIGHT   eval  10 cm proximal to olecranon process 30.2  Olecranon process 25.2  10 cm proximal to ulnar styloid process 22.7  Just proximal to ulnar styloid process 16.1  Across hand at thumb web space 19.8  At base of 2nd digit 6.5  (Blank rows = not tested)  LANDMARK LEFT   eval  10 cm proximal to olecranon process 31.9  Olecranon process 25.3  10 cm proximal to ulnar styloid process 21.2  Just proximal to ulnar styloid process 16.1  Across hand at thumb web space 19.3  At base of 2nd digit 6.4  (Blank rows = not tested)  L-DEX LYMPHEDEMA SCREENING:  The patient was assessed using the L-Dex machine today to produce a lymphedema index baseline score. The patient will be reassessed on a regular basis (typically every 3 months) to obtain new L-Dex scores. If the score is > 6.5 points away from his/her baseline score indicating onset of subclinical lymphedema, it will be recommended to wear a compression garment for 4 weeks, 12 hours per day and then be reassessed. If the score continues to be > 6.5 points from baseline at reassessment, we will initiate lymphedema treatment. Assessing in this manner has a 95% rate of preventing clinically significant lymphedema.   L-DEX FLOWSHEETS - 11/28/23 1000       L-DEX LYMPHEDEMA SCREENING   Measurement Type Unilateral    L-DEX MEASUREMENT EXTREMITY Upper Extremity    POSITION  Standing    DOMINANT SIDE Right    At Risk Side Left    BASELINE SCORE (UNILATERAL) -0.5          QUICK DASH SURVEY:  Junie Palin - 11/28/23 0001     Open a tight or new jar No difficulty    Do heavy  household chores (wash walls, wash floors) No difficulty    Carry a shopping bag or  briefcase No difficulty    Wash your back No difficulty    Use a knife to cut food No difficulty    Recreational activities in which you take some force or impact through your arm, shoulder, or hand (golf, hammering, tennis) No difficulty    During the past week, to what extent has your arm, shoulder or hand problem interfered with your normal social activities with family, friends, neighbors, or groups? Not at all    During the past week, to what extent has your arm, shoulder or hand problem limited your work or other regular daily activities Not at all    Arm, shoulder, or hand pain. None    Tingling (pins and needles) in your arm, shoulder, or hand None    Difficulty Sleeping No difficulty    DASH Score 0 %           PATIENT EDUCATION:  Education details: Time spent educating patient on aspects of self-care to maximize post op recovery. Patient was educated on where and how to get a post op compression bra to use to reduce post op edema. Patient was also educated on the use of SOZO screenings and surveillance principles for early identification of lymphedema onset. She was instructed to use the post op pillow in the axilla for pressure and pain relief. Patient educated on lymphedema risk reduction and post op shoulder/posture HEP. Person educated: Patient Education method: Explanation, Demonstration, Handout Education comprehension: Patient verbalized understanding and returned demonstration  HOME EXERCISE PROGRAM: Patient was instructed today in a home exercise program today for post op shoulder range of motion. These included active assist shoulder flexion in sitting, scapular retraction, wall walking with shoulder abduction, and hands behind head external rotation.  She was encouraged to do these twice a day, holding 3 seconds and repeating 5 times when permitted by her physician.   ASSESSMENT:  CLINICAL IMPRESSION: Patient was diagnosed on 11/13/2023 with left grade 2 invasive  lobular carcinoma breast cancer. It measures 8 cm and is located in the upper outer quadrant. It is ER/PR positive and HER2 negative with a Ki67 of 20%. She has a history of left breast cancer DCIS in 2009 treated with a lumpectomy and radiation.Her multidisciplinary medical team met prior to her assessments to determine a recommended treatment plan. She is planning to have a left mastectomy and sentinel node biopsy followed by Oncotype testing and anti-estrogen therapy. She will benefit from a post op PT reassessment to determine needs and from L-Dex screens every 3 months for 2 years to detect subclinical lymphedema.  Pt will benefit from skilled therapeutic intervention to improve on the following deficits: Decreased knowledge of precautions, impaired UE functional use, pain, decreased ROM, postural dysfunction.   PT treatment/interventions: ADL/self-care home management, pt/family education, therapeutic exercise  REHAB POTENTIAL: Excellent  CLINICAL DECISION MAKING: Stable/uncomplicated  EVALUATION COMPLEXITY: Low   GOALS: Goals reviewed with patient? YES  LONG TERM GOALS: (STG=LTG)    Name Target Date Goal status  1 Pt will be able to verbalize understanding of pertinent lymphedema risk reduction practices relevant to her dx specifically related to skin care.  Baseline:  No knowledge 11/28/2023 Achieved at eval  2 Pt will be able to return demo and/or verbalize understanding of the post op HEP related to regaining shoulder ROM. Baseline:  No knowledge 11/28/2023 Achieved at eval  3 Pt will be able to verbalize  understanding of the importance of viewing the post op After Breast CA Class video for further lymphedema risk reduction education and therapeutic exercise.  Baseline:  No knowledge 11/28/2023 Achieved at eval  4 Pt will demo she has regained full shoulder ROM and function post operatively compared to baselines.  Baseline: See objective measurements taken today. 01/23/2024      PLAN:  PT FREQUENCY/DURATION: EVAL and 1 follow up appointment.   PLAN FOR NEXT SESSION: will reassess 3-4 weeks post op to determine needs.   Patient will follow up at outpatient cancer rehab 3-4 weeks following surgery.  If the patient requires physical therapy at that time, a specific plan will be dictated and sent to the referring physician for approval. The patient was educated today on appropriate basic range of motion exercises to begin post operatively and the importance of viewing the After Breast Cancer class video following surgery.  Patient was educated today on lymphedema risk reduction practices as it pertains to recommendations that will benefit the patient immediately following surgery.  She verbalized good understanding.    Physical Therapy Information for After Breast Cancer Surgery/Treatment:  Lymphedema is a swelling condition that you may be at risk for in your arm if you have lymph nodes removed from the armpit area.  After a sentinel node biopsy, the risk is approximately 5-9% and is higher after an axillary node dissection.  There is treatment available for this condition and it is not life-threatening.  Contact your physician or physical therapist with concerns. You may begin the 4 shoulder/posture exercises (see additional sheet) when permitted by your physician (typically a week after surgery).  If you have drains, you may need to wait until those are removed before beginning range of motion exercises.  A general recommendation is to not lift your arms above shoulder height until drains are removed.  These exercises should be done to your tolerance and gently.  This is not a no pain/no gain type of recovery so listen to your body and stretch into the range of motion that you can tolerate, stopping if you have pain.  If you are having immediate reconstruction, ask your plastic surgeon about doing exercises as he or she may want you to wait. We encourage you to view the  After Breast Cancer class video following surgery.  You will learn information related to lymphedema risk, prevention and treatment and additional exercises to regain mobility following surgery.   While undergoing any medical procedure or treatment, try to avoid blood pressure being taken or needle sticks from occurring on the arm on the side of cancer.   This recommendation begins after surgery and continues for the rest of your life.  This may help reduce your risk of getting lymphedema (swelling in your arm). An excellent resource for those seeking information on lymphedema is the National Lymphedema Network's web site. It can be accessed at www.lymphnet.org If you notice swelling in your hand, arm or breast at any time following surgery (even if it is many years from now), please contact your doctor or physical therapist to discuss this.  Lymphedema can be treated at any time but it is easier for you if it is treated early on.  If you feel like your shoulder motion is not returning to normal in a reasonable amount of time, please contact your surgeon or physical therapist.  [facility] [phone]. [address], [address], [place] [address]  [facility] CLASS After Breast Cancer Class  After Breast Cancer Class is a specially designed exercise class video to assist you in a safe recover after having breast cancer surgery.  In this video you will learn how to get back to full function whether your drains were just removed or if you had surgery a month ago. The video can be viewed on this page: [email or web address].boyd-meyer.org/ or on YouTube here: https://youtu.az/[email or web address].  Class Goals  Understand specific stretches to improve the flexibility of you chest and shoulder. Learn ways to safely strengthen your upper body and improve your posture. Understand the warning signs of infection and why you may be at risk  for an arm infection. Learn about Lymphedema and prevention.  ** You do not need to view this video until after surgery.  Drains should be removed to participate in the recommended exercises on the video.  Patient was instructed today in a home exercise program today for post op shoulder range of motion. These included active assist shoulder flexion in sitting, scapular retraction, wall walking with shoulder abduction, and hands behind head external rotation.  She was encouraged to do these twice a day, holding 3 seconds and repeating 5 times when permitted by her physician.  Eward Wonda Sharps,  11/28/23 11:24 AM

## 2023-11-29 ENCOUNTER — Other Ambulatory Visit: Payer: Self-pay | Admitting: Surgery

## 2023-11-30 ENCOUNTER — Telehealth: Payer: Self-pay | Admitting: Hematology and Oncology

## 2023-11-30 NOTE — Telephone Encounter (Signed)
 Spoke with patient confirming upcoming appointment

## 2023-12-03 ENCOUNTER — Telehealth: Payer: Self-pay

## 2023-12-03 DIAGNOSIS — Z170 Estrogen receptor positive status [ER+]: Secondary | ICD-10-CM

## 2023-12-03 NOTE — Telephone Encounter (Addendum)
 [employer] 2021-05 - Specimen Collection Study to Evaluate Biomarkers in Subjects with Cancer    Followed up with pt via phone call about interest in this specimen study. Pt declined study participation and mentioned she has a lot on her plate. Patient has contact information if she has any questions. Dr Loretha notified.  Laury Quale, MPH  Clinical Research Coordinator

## 2023-12-06 ENCOUNTER — Encounter (HOSPITAL_BASED_OUTPATIENT_CLINIC_OR_DEPARTMENT_OTHER): Payer: Self-pay | Admitting: Surgery

## 2023-12-06 ENCOUNTER — Telehealth: Payer: Self-pay | Admitting: *Deleted

## 2023-12-06 ENCOUNTER — Encounter: Payer: Self-pay | Admitting: *Deleted

## 2023-12-06 NOTE — Telephone Encounter (Signed)
 Spoke with patient to follow up from [facility] 7/23 and assess navigation needs. Answered questions and concerns she had regarding her upcoming mastectomy. Referral for [employer] made per patient's request.  Encouraged her to call if she had any additional questions or concerns.  Patient verbalized understanding.

## 2023-12-07 ENCOUNTER — Encounter (HOSPITAL_BASED_OUTPATIENT_CLINIC_OR_DEPARTMENT_OTHER)
Admission: RE | Admit: 2023-12-07 | Discharge: 2023-12-07 | Disposition: A | Discharge status: Home / Self Care | Source: Ambulatory Visit | Attending: Surgery | Admitting: Surgery

## 2023-12-07 DIAGNOSIS — Z01818 Encounter for other preprocedural examination: Secondary | ICD-10-CM | POA: Diagnosis present

## 2023-12-07 MED ORDER — ENSURE PRE-SURGERY PO LIQD: 296.0000 mL | Freq: Once | ORAL | Status: DC

## 2023-12-07 NOTE — Progress Notes (Signed)

## 2023-12-10 ENCOUNTER — Encounter: Payer: Self-pay | Admitting: Genetic Counselor

## 2023-12-10 ENCOUNTER — Telehealth: Payer: Self-pay | Admitting: Genetic Counselor

## 2023-12-10 DIAGNOSIS — Z1379 Encounter for other screening for genetic and chromosomal anomalies: Secondary | ICD-10-CM | POA: Insufficient documentation

## 2023-12-10 NOTE — Telephone Encounter (Signed)
 I contacted Ms. Wisdom to discuss her genetic testing results. No pathogenic variants were identified in the 40 genes analyzed. Detailed clinic note to follow.  The test report has been scanned into EPIC and is located under the Molecular Pathology section of the Results Review tab.  A portion of the result report is included below for reference.   Fate Caster, MS, [facility] Genetic Counselor [email or web address] (P) [phone]

## 2023-12-12 NOTE — H&P (Signed)
 REFERRING PHYSICIAN: Loretha Linnette Lane Mamie* PROVIDER: VICENTA DASIE POLI, MD MRN: [id number] DOB: 03-26-1951  Subjective   Chief Complaint: Breast Cancer  History of Present Illness: Bonnie Collins is a 73 y.o. female who is seen today as an office consultation for evaluation of Breast Cancer  This is a 73 year old female with a previous history of ductal carcinoma in situ of the left breast who was found to have an increased density in films on her left breast but not on the right. She underwent a contrast-enhanced mammogram showing a 4.7 x 6 cm x 8 cm area of non-mass like enhancement in the left breast. 2 areas were biopsied both showing invasive lobular carcinoma which is grade 2. Ultrasound the axilla was unremarkable. The cancer was 95% ER positive, 50% PR positive, HER2 negative, and a Ki-67 of 20%. With her previous DCIS to the left breast in 2009 she had undergone radiation treatment as well as antihormonal treatment. She currently has no problems regarding her breast. She denies nipple discharge. She is otherwise without complaints.  Review of Systems: A complete review of systems was obtained from the patient. I have reviewed this information and discussed as appropriate with the patient. See HPI as well for other ROS.  ROS   Medical History: Past Medical History:  Diagnosis Date  Arthritis  History of cancer  History of tonsillectomy  Age 89   Patient Active Problem List  Diagnosis  DCIS (ductal carcinoma in situ)  History of breast cancer  Malignant neoplasm of overlapping sites of left breast in female, estrogen receptor positive (CMS/HHS-HCC)   Past Surgical History:  Procedure Laterality Date  .Left Breast Lumpectomy Left  2009  ENDOSCOPIC CARPAL TUNNEL RELEASE Bilateral    No Known Allergies  Current Outpatient Medications on File Prior to Visit  Medication Sig Dispense Refill  losartan  (COZAAR ) 50 MG tablet Take 50 mg by mouth once daily   rosuvastatin (CRESTOR) 10 MG tablet Take 10 mg by mouth at bedtime  cholecalciferol (VITAMIN D3) 1000 unit tablet Take 1,000 Units by mouth once daily  multivitamin tablet Take 1 tablet by mouth once daily   No current facility-administered medications on file prior to visit.   Family History  Problem Relation Age of Onset  Diabetes Mother    Social History   Tobacco Use  Smoking Status Never  Smokeless Tobacco Never    Social History   Socioeconomic History  Marital status: Single  Tobacco Use  Smoking status: Never  Smokeless tobacco: Never  Vaping Use  Vaping status: Never Used  Substance and Sexual Activity  Drug use: Never   Social Drivers of Health   Food Insecurity: Low Risk (07/12/2023)  Received from [facility]  Hunger Vital Sign  Within the past 12 months, you worried that your food would run out before you got money to buy more: Never true  Within the past 12 months, the food you bought just didn't last and you didn't have money to get more. : Never true  Transportation Needs: No Transportation Needs (07/12/2023)  Received from [employer]  In the past 12 months, has lack of reliable transportation kept you from medical appointments, meetings, work or from getting things needed for daily living? : No  Housing Stability: Low Risk (07/12/2023)  Received from [facility] Stability Vital Sign  What is your living situation today?: I have a steady place to live  Think about the place you live. Do you have problems with  any of the following? Choose all that apply:: None/None on this list   Objective:  There were no vitals filed for this visit.  There is no height or weight on file to calculate BMI.  Physical Exam   A chaperone was present for the exam  She appears well on exam.  There are no palpable masses in either breast. There is no axillary adenopathy on either side and the nipple areolar complexes are normal.  Labs,  Imaging and Diagnostic Testing: I have reviewed her mammograms, ultrasound, and pathology results  Assessment and Plan:   Diagnoses and all orders for this visit:  Invasive lobular carcinoma of left breast in female (CMS/HHS-HCC)   I discussed diagnosis with the patient and her family. We have also discussed her in a multidisciplinary breast cancer conference. Given the size of the area of non-mass like enhancement with the invasive lobular carcinoma as well as her previous history of radiation to the left breast, a left breast mastectomy and sentinel lymph node biopsy is strongly recommended. A lumpectomy would leave a large cosmetic defect and again she is not a candidate for further radiation therapy. I explained the mastectomy and sentinel lymph node biopsy procedure with her. We discussed the risks which includes but is not limited to bleeding, infection, chronic lymphedema in the arm, the fact that there would be a drain postoperatively for several weeks, cardiopulmonary issues with anesthesia, injury to surrounding structures, etc. We also discussed the possibility of reconstruction by plastic surgery. At this point she has agreed to mastectomy with sentinel node biopsy but is still making decision regarding whether or not she wants reconstruction. Surgery will be scheduled.  Addendum: She has decided against reconstruction so we will proceed with a left mastectomy and sentinel lymph node biopsy.

## 2023-12-13 ENCOUNTER — Ambulatory Visit (HOSPITAL_BASED_OUTPATIENT_CLINIC_OR_DEPARTMENT_OTHER): Admitting: Certified Registered"

## 2023-12-13 ENCOUNTER — Other Ambulatory Visit: Payer: Self-pay

## 2023-12-13 ENCOUNTER — Ambulatory Visit (HOSPITAL_BASED_OUTPATIENT_CLINIC_OR_DEPARTMENT_OTHER)
Admission: RE | Admit: 2023-12-13 | Discharge: 2023-12-14 | Disposition: A | Discharge status: Home / Self Care | Attending: Surgery | Admitting: Surgery

## 2023-12-13 ENCOUNTER — Encounter (HOSPITAL_BASED_OUTPATIENT_CLINIC_OR_DEPARTMENT_OTHER)
Admission: RE | Disposition: A | Discharge status: Home / Self Care | Payer: Self-pay | Source: Home / Self Care | Attending: Surgery

## 2023-12-13 ENCOUNTER — Encounter (HOSPITAL_BASED_OUTPATIENT_CLINIC_OR_DEPARTMENT_OTHER): Payer: Self-pay | Admitting: Surgery

## 2023-12-13 DIAGNOSIS — Z833 Family history of diabetes mellitus: Secondary | ICD-10-CM | POA: Insufficient documentation

## 2023-12-13 DIAGNOSIS — Z170 Estrogen receptor positive status [ER+]: Secondary | ICD-10-CM | POA: Insufficient documentation

## 2023-12-13 DIAGNOSIS — C50912 Malignant neoplasm of unspecified site of left female breast: Secondary | ICD-10-CM | POA: Diagnosis present

## 2023-12-13 DIAGNOSIS — C773 Secondary and unspecified malignant neoplasm of axilla and upper limb lymph nodes: Secondary | ICD-10-CM | POA: Diagnosis not present

## 2023-12-13 DIAGNOSIS — Z1732 Human epidermal growth factor receptor 2 negative status: Secondary | ICD-10-CM | POA: Diagnosis not present

## 2023-12-13 DIAGNOSIS — Z79899 Other long term (current) drug therapy: Secondary | ICD-10-CM | POA: Insufficient documentation

## 2023-12-13 DIAGNOSIS — Z6841 Body Mass Index (BMI) 40.0 and over, adult: Secondary | ICD-10-CM | POA: Diagnosis not present

## 2023-12-13 DIAGNOSIS — Z1721 Progesterone receptor positive status: Secondary | ICD-10-CM | POA: Insufficient documentation

## 2023-12-13 DIAGNOSIS — E66813 Obesity, class 3: Secondary | ICD-10-CM | POA: Insufficient documentation

## 2023-12-13 DIAGNOSIS — M1990 Unspecified osteoarthritis, unspecified site: Secondary | ICD-10-CM | POA: Insufficient documentation

## 2023-12-13 DIAGNOSIS — Z853 Personal history of malignant neoplasm of breast: Secondary | ICD-10-CM | POA: Diagnosis not present

## 2023-12-13 DIAGNOSIS — I10 Essential (primary) hypertension: Secondary | ICD-10-CM | POA: Diagnosis not present

## 2023-12-13 DIAGNOSIS — Z01818 Encounter for other preprocedural examination: Secondary | ICD-10-CM

## 2023-12-13 HISTORY — PX: MASTECTOMY W/ SENTINEL NODE BIOPSY: SHX2001

## 2023-12-13 SURGERY — MASTECTOMY WITH SENTINEL LYMPH NODE BIOPSY
Anesthesia: General | Site: Breast | Laterality: Left

## 2023-12-13 MED ORDER — ROPIVACAINE HCL 5 MG/ML IJ SOLN
INTRAMUSCULAR | Status: DC | PRN
Start: 2023-12-13 — End: 2023-12-13
  Administered 2023-12-13: 30 mL via PERINEURAL

## 2023-12-13 MED ORDER — OXYCODONE HCL 5 MG PO TABS: 5.0000 mg | ORAL_TABLET | Freq: Once | ORAL | Status: DC | PRN

## 2023-12-13 MED ORDER — HYDROMORPHONE HCL 1 MG/ML IJ SOLN
INTRAMUSCULAR | Status: AC
Start: 2023-12-13 — End: 2023-12-13
  Filled 2023-12-13: qty 0.5

## 2023-12-13 MED ORDER — ONDANSETRON HCL 4 MG/2ML IJ SOLN
INTRAMUSCULAR | Status: AC
  Filled 2023-12-13: qty 2

## 2023-12-13 MED ORDER — CHLORHEXIDINE GLUCONATE CLOTH 2 % EX PADS: 6.0000 | MEDICATED_PAD | Freq: Once | CUTANEOUS | Status: DC

## 2023-12-13 MED ORDER — ONDANSETRON HCL 4 MG/2ML IJ SOLN: 4.0000 mg | Freq: Four times a day (QID) | INTRAMUSCULAR | Status: DC | PRN

## 2023-12-13 MED ORDER — MIDAZOLAM HCL 2 MG/2ML IJ SOLN
1.0000 mg | Freq: Once | INTRAMUSCULAR | Status: AC
  Administered 2023-12-13: 1 mg via INTRAVENOUS

## 2023-12-13 MED ORDER — ACETAMINOPHEN 500 MG PO TABS
1000.0000 mg | ORAL_TABLET | Freq: Four times a day (QID) | ORAL | Status: DC
  Administered 2023-12-13 – 2023-12-14 (×2): 1000 mg via ORAL
  Filled 2023-12-13 (×2): qty 2

## 2023-12-13 MED ORDER — GLYCOPYRROLATE PF 0.2 MG/ML IJ SOSY
PREFILLED_SYRINGE | INTRAMUSCULAR | Status: AC
  Filled 2023-12-13: qty 1

## 2023-12-13 MED ORDER — DIPHENHYDRAMINE HCL 12.5 MG/5ML PO ELIX
12.5000 mg | ORAL_SOLUTION | Freq: Four times a day (QID) | ORAL | Status: DC | PRN
Start: 2023-12-13 — End: 2023-12-14

## 2023-12-13 MED ORDER — LOSARTAN POTASSIUM 50 MG PO TABS: 50.0000 mg | ORAL_TABLET | Freq: Every day | ORAL | Status: DC

## 2023-12-13 MED ORDER — FENTANYL CITRATE (PF) 100 MCG/2ML IJ SOLN
100.0000 ug | Freq: Once | INTRAMUSCULAR | Status: AC
  Administered 2023-12-13: 100 ug via INTRAVENOUS

## 2023-12-13 MED ORDER — METHOCARBAMOL 500 MG PO TABS
500.0000 mg | ORAL_TABLET | Freq: Four times a day (QID) | ORAL | Status: DC | PRN
Start: 2023-12-13 — End: 2023-12-14
  Administered 2023-12-13: 500 mg via ORAL
  Filled 2023-12-13: qty 1

## 2023-12-13 MED ORDER — LACTATED RINGERS IV SOLN: INTRAVENOUS | Status: DC

## 2023-12-13 MED ORDER — CEFAZOLIN SODIUM-DEXTROSE 2-4 GM/100ML-% IV SOLN
INTRAVENOUS | Status: AC
  Filled 2023-12-13: qty 100

## 2023-12-13 MED ORDER — PROPOFOL 10 MG/ML IV BOLUS
INTRAVENOUS | Status: AC
  Filled 2023-12-13: qty 20

## 2023-12-13 MED ORDER — FENTANYL CITRATE (PF) 100 MCG/2ML IJ SOLN
INTRAMUSCULAR | Status: DC | PRN
  Administered 2023-12-13 (×4): 25 ug via INTRAVENOUS

## 2023-12-13 MED ORDER — GLYCOPYRROLATE 0.2 MG/ML IJ SOLN
INTRAMUSCULAR | Status: DC | PRN
  Administered 2023-12-13: .2 mg via INTRAVENOUS

## 2023-12-13 MED ORDER — CEFAZOLIN SODIUM-DEXTROSE 2-4 GM/100ML-% IV SOLN
2.0000 g | INTRAVENOUS | Status: AC
  Administered 2023-12-13: 2 g via INTRAVENOUS

## 2023-12-13 MED ORDER — OXYCODONE HCL 5 MG/5ML PO SOLN: 5.0000 mg | Freq: Once | ORAL | Status: DC | PRN

## 2023-12-13 MED ORDER — ONDANSETRON HCL 4 MG/2ML IJ SOLN
INTRAMUSCULAR | Status: DC | PRN
  Administered 2023-12-13: 4 mg via INTRAVENOUS

## 2023-12-13 MED ORDER — SODIUM CHLORIDE 0.9 % IV SOLN: INTRAVENOUS | Status: DC

## 2023-12-13 MED ORDER — MIDAZOLAM HCL 2 MG/2ML IJ SOLN
INTRAMUSCULAR | Status: AC
  Filled 2023-12-13: qty 2

## 2023-12-13 MED ORDER — MAGTRACE LYMPHATIC TRACER
INTRAMUSCULAR | Status: DC | PRN
  Administered 2023-12-13: 2 mL via INTRAMUSCULAR

## 2023-12-13 MED ORDER — ENOXAPARIN SODIUM 40 MG/0.4ML IJ SOSY: 40.0000 mg | PREFILLED_SYRINGE | Freq: Every day | INTRAMUSCULAR | Status: DC

## 2023-12-13 MED ORDER — DEXAMETHASONE SODIUM PHOSPHATE 10 MG/ML IJ SOLN
INTRAMUSCULAR | Status: AC
  Filled 2023-12-13: qty 1

## 2023-12-13 MED ORDER — BUPIVACAINE-EPINEPHRINE (PF) 0.5% -1:200000 IJ SOLN
INTRAMUSCULAR | Status: AC
Start: 2023-12-13 — End: 2023-12-13
  Filled 2023-12-13: qty 30

## 2023-12-13 MED ORDER — ONDANSETRON 4 MG PO TBDP: 4.0000 mg | ORAL_TABLET | Freq: Four times a day (QID) | ORAL | Status: DC | PRN

## 2023-12-13 MED ORDER — HYDROMORPHONE HCL 1 MG/ML IJ SOLN: 1.0000 mg | INTRAMUSCULAR | Status: DC | PRN

## 2023-12-13 MED ORDER — FENTANYL CITRATE (PF) 100 MCG/2ML IJ SOLN
INTRAMUSCULAR | Status: AC
  Filled 2023-12-13: qty 2

## 2023-12-13 MED ORDER — EPHEDRINE SULFATE (PRESSORS) 50 MG/ML IJ SOLN
INTRAMUSCULAR | Status: DC | PRN
  Administered 2023-12-13: 5 mg via INTRAVENOUS

## 2023-12-13 MED ORDER — HYDROMORPHONE HCL 1 MG/ML IJ SOLN
0.2500 mg | INTRAMUSCULAR | Status: DC | PRN
  Administered 2023-12-13: 0.5 mg via INTRAVENOUS

## 2023-12-13 MED ORDER — LIDOCAINE HCL (CARDIAC) PF 100 MG/5ML IV SOSY
PREFILLED_SYRINGE | INTRAVENOUS | Status: DC | PRN
  Administered 2023-12-13: 40 mg via INTRAVENOUS

## 2023-12-13 MED ORDER — PROPOFOL 10 MG/ML IV BOLUS
INTRAVENOUS | Status: DC | PRN
  Administered 2023-12-13: 200 mg via INTRAVENOUS

## 2023-12-13 MED ORDER — EPHEDRINE 5 MG/ML INJ
INTRAVENOUS | Status: AC
  Filled 2023-12-13: qty 5

## 2023-12-13 MED ORDER — SODIUM CHLORIDE 0.9 % IV SOLN: 12.5000 mg | INTRAVENOUS | Status: DC | PRN

## 2023-12-13 MED ORDER — DIPHENHYDRAMINE HCL 50 MG/ML IJ SOLN
12.5000 mg | Freq: Four times a day (QID) | INTRAMUSCULAR | Status: DC | PRN
Start: 2023-12-13 — End: 2023-12-14

## 2023-12-13 MED ORDER — 0.9 % SODIUM CHLORIDE (POUR BTL) OPTIME
TOPICAL | Status: DC | PRN
  Administered 2023-12-13: 1000 mL

## 2023-12-13 MED ORDER — OXYCODONE HCL 5 MG PO TABS: 5.0000 mg | ORAL_TABLET | ORAL | Status: DC | PRN

## 2023-12-13 MED ORDER — LIDOCAINE 2% (20 MG/ML) 5 ML SYRINGE
INTRAMUSCULAR | Status: AC
  Filled 2023-12-13: qty 5

## 2023-12-13 MED ORDER — DEXAMETHASONE SODIUM PHOSPHATE 10 MG/ML IJ SOLN
INTRAMUSCULAR | Status: DC | PRN
  Administered 2023-12-13: 5 mg via INTRAVENOUS

## 2023-12-13 MED ORDER — TRAMADOL HCL 50 MG PO TABS
50.0000 mg | ORAL_TABLET | Freq: Four times a day (QID) | ORAL | Status: DC | PRN
  Administered 2023-12-13: 50 mg via ORAL
  Filled 2023-12-13: qty 1

## 2023-12-13 MED ORDER — ACETAMINOPHEN 500 MG PO TABS
ORAL_TABLET | ORAL | Status: AC
  Filled 2023-12-13: qty 2

## 2023-12-13 MED ORDER — AMISULPRIDE (ANTIEMETIC) 5 MG/2ML IV SOLN: 10.0000 mg | Freq: Once | INTRAVENOUS | Status: DC | PRN

## 2023-12-13 MED ORDER — ACETAMINOPHEN 500 MG PO TABS
1000.0000 mg | ORAL_TABLET | ORAL | Status: AC
  Administered 2023-12-13: 1000 mg via ORAL

## 2023-12-13 SURGICAL SUPPLY — 39 items
BLADE CLIPPER SURG (BLADE) IMPLANT
BLADE SURG 15 STRL LF DISP TIS (BLADE) ×2 IMPLANT
CANISTER SUCT 1200ML W/VALVE (MISCELLANEOUS) ×2 IMPLANT
CHLORAPREP W/TINT 26 (MISCELLANEOUS) ×2 IMPLANT
CLIP APPLIE 9.375 MED OPEN (MISCELLANEOUS) IMPLANT
COVER BACK TABLE 60X90IN (DRAPES) ×2 IMPLANT
COVER MAYO STAND STRL (DRAPES) ×2 IMPLANT
COVER PROBE CYLINDRICAL 5X96 (MISCELLANEOUS) ×2 IMPLANT
DERMABOND ADVANCED .7 DNX12 (GAUZE/BANDAGES/DRESSINGS) ×2 IMPLANT
DRAIN CHANNEL 19F RND (DRAIN) ×2 IMPLANT
DRAPE LAPAROSCOPIC ABDOMINAL (DRAPES) ×2 IMPLANT
DRAPE UTILITY XL STRL (DRAPES) ×2 IMPLANT
ELECTRODE REM PT RTRN 9FT ADLT (ELECTROSURGICAL) ×2 IMPLANT
EVACUATOR SILICONE 100CC (DRAIN) ×2 IMPLANT
GAUZE SPONGE 4X4 12PLY STRL LF (GAUZE/BANDAGES/DRESSINGS) IMPLANT
GLOVE SURG SIGNA 7.5 PF LTX (GLOVE) ×2 IMPLANT
GOWN STRL REUS W/ TWL LRG LVL3 (GOWN DISPOSABLE) ×2 IMPLANT
GOWN STRL REUS W/ TWL XL LVL3 (GOWN DISPOSABLE) ×2 IMPLANT
HEMOSTAT SURGICEL 2X14 (HEMOSTASIS) IMPLANT
LIGHT WAVEGUIDE WIDE FLAT (MISCELLANEOUS) ×2 IMPLANT
NDL HYPO 25X1 1.5 SAFETY (NEEDLE) ×4 IMPLANT
NDL SAFETY ECLIPSE 18X1.5 (NEEDLE) ×2 IMPLANT
NEEDLE HYPO 25X1 1.5 SAFETY (NEEDLE) ×2 IMPLANT
NS IRRIG 1000ML POUR BTL (IV SOLUTION) ×2 IMPLANT
PACK BASIN DAY SURGERY FS (CUSTOM PROCEDURE TRAY) ×2 IMPLANT
PENCIL SMOKE EVACUATOR (MISCELLANEOUS) ×2 IMPLANT
PIN SAFETY STERILE (MISCELLANEOUS) ×2 IMPLANT
SLEEVE SCD COMPRESS KNEE MED (STOCKING) ×2 IMPLANT
SPIKE FLUID TRANSFER (MISCELLANEOUS) IMPLANT
SPONGE T-LAP 18X18 ~~LOC~~+RFID (SPONGE) ×2 IMPLANT
SUT ETHILON 3 0 PS 1 (SUTURE) ×2 IMPLANT
SUT MNCRL AB 4-0 PS2 18 (SUTURE) ×2 IMPLANT
SUT VIC AB 3-0 SH 27X BRD (SUTURE) ×2 IMPLANT
SYR BULB EAR ULCER 3OZ GRN STR (SYRINGE) ×2 IMPLANT
SYR CONTROL 10ML LL (SYRINGE) ×4 IMPLANT
TOWEL GREEN STERILE FF (TOWEL DISPOSABLE) ×2 IMPLANT
TRACER MAGTRACE VIAL (MISCELLANEOUS) IMPLANT
TUBE CONNECTING 20X1/4 (TUBING) ×2 IMPLANT
YANKAUER SUCT BULB TIP NO VENT (SUCTIONS) ×2 IMPLANT

## 2023-12-13 NOTE — Anesthesia Preprocedure Evaluation (Addendum)
 Anesthesia Evaluation    Airway Mallampati: II  TM Distance: >3 FB Neck ROM: Full    Dental no notable dental hx.    Pulmonary    Pulmonary exam normal breath sounds clear to auscultation       Cardiovascular hypertension, Pt. on medications Normal cardiovascular exam Rhythm:Regular Rate:Normal     Neuro/Psych    GI/Hepatic   Endo/Other    Class 3 obesity  Renal/GU      Musculoskeletal  (+) Arthritis , Osteoarthritis,    Abdominal  (+) + obese  Peds  Hematology   Anesthesia Other Findings Breast Cancer  Reproductive/Obstetrics                              Anesthesia Physical Anesthesia Plan  ASA: 3  Anesthesia Plan: General   Post-op Pain Management: Regional block* and Tylenol  PO (pre-op)*   Induction: Intravenous  PONV Risk Score and Plan: 3 and Ondansetron , Dexamethasone , Midazolam  and Treatment may vary due to age or medical condition  Airway Management Planned: LMA  Additional Equipment:   Intra-op Plan:   Post-operative Plan: Extubation in OR  Informed Consent: I have reviewed the patients History and Physical, chart, labs and discussed the procedure including the risks, benefits and alternatives for the proposed anesthesia with the patient or authorized representative who has indicated his/her understanding and acceptance.     Dental advisory given  Plan Discussed with: CRNA  Anesthesia Plan Comments:         Anesthesia Quick Evaluation

## 2023-12-13 NOTE — Anesthesia Postprocedure Evaluation (Signed)
 Anesthesia Post Note  Patient: Bonnie Collins  Procedure(s) Performed: MASTECTOMY WITH SENTINEL LYMPH NODE BIOPSY (Left: Breast)     Patient location during evaluation: PACU Anesthesia Type: General Level of consciousness: awake and alert Pain management: pain level controlled Vital Signs Assessment: post-procedure vital signs reviewed and stable Respiratory status: spontaneous breathing, nonlabored ventilation and respiratory function stable Cardiovascular status: blood pressure returned to baseline and stable Postop Assessment: no apparent nausea or vomiting Anesthetic complications: no   No notable events documented.  Last Vitals:  Vitals:   12/13/23 1615 12/13/23 1630  BP: (!) 147/80 135/84  Pulse: 81 82  Resp: 11 11  Temp:    SpO2: 100% 91%    Last Pain:  Vitals:   12/13/23 1606  TempSrc:   PainSc: 3                  Butler Levander Pinal

## 2023-12-13 NOTE — Transfer of Care (Signed)
 Immediate Anesthesia Transfer of Care Note  Patient: NAHDIA DOUCET  Procedure(s) Performed: MASTECTOMY WITH SENTINEL LYMPH NODE BIOPSY (Left: Breast)  Patient Location: PACU  Anesthesia Type:GA combined with regional for post-op pain  Level of Consciousness: drowsy  Airway & Oxygen Therapy: Patient Spontanous Breathing and Patient connected to face mask oxygen  Post-op Assessment: Report given to RN and Post -op Vital signs reviewed and stable  Post vital signs: Reviewed and stable  Last Vitals:  Vitals Value Taken Time  BP 122/62 12/13/23 16:05  Temp    Pulse 80 12/13/23 16:08  Resp 11 12/13/23 16:08  SpO2 100 % 12/13/23 16:08  Vitals shown include unfiled device data.  Last Pain:  Vitals:   12/13/23 1227  TempSrc: Oral  PainSc: 0-No pain      Patients Stated Pain Goal: 4 (12/13/23 1227)  Complications: No notable events documented.

## 2023-12-13 NOTE — Progress Notes (Signed)
 Assisted Dr. Hyacinth Meeker with left, pectoralis, ultrasound guided block. Side rails up, monitors on throughout procedure. See vital signs in flow sheet. Tolerated Procedure well.

## 2023-12-13 NOTE — Interval H&P Note (Signed)
 History and Physical Interval Note: no change in H and P  12/13/2023 12:58 PM  Bonnie Collins  has presented today for surgery, with the diagnosis of LEFT BREAST CANCER.  The various methods of treatment have been discussed with the patient and family. After consideration of risks, benefits and other options for treatment, the patient has consented to  Procedure(s) with comments: MASTECTOMY WITH SENTINEL LYMPH NODE BIOPSY (Left) - LEFT BREAST MASTECTOMY AND SENTINEL NODE BIOPSY as a surgical intervention.  The patient's history has been reviewed, patient examined, no change in status, stable for surgery.  I have reviewed the patient's chart and labs.  Questions were answered to the patient's satisfaction.     Vicenta Poli

## 2023-12-13 NOTE — Op Note (Signed)
 Bonnie Collins 12/13/2023   Pre-op Diagnosis: LEFT BREAST CANCER     Post-op Diagnosis: SAME  Procedure(s): LEFT TOTAL MASTECTOMY DEEP LEFT AXILLARY SENTINEL LYMPH NODE BIOPSY INJECTION OF MAG TRACE FOR LYMPH NODE MAPPING  Surgeon(s): Vernetta Berg, MD  Anesthesia: General  Staff:  Circulator: Lelon Daphne BROCKS, RN Relief Circulator: Lenon Sonny BROCKS, RN; Wilmon Antonio SQUIBB, RN Relief Scrub: Wilmon Antonio SQUIBB, RN; Alto Charmaine PARAS Scrub Person: Arbutus Suzen HERO Circulator Assistant: Eliberto Geroge HERO, RN  Estimated Blood Loss: Minimal               Specimens: sent to path  Indications: This is a 73 year old female recently diagnosed with invasive lobular carcinoma of the left breast.  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  Her MRI showed a large area of non-mass like enhancement.  Because of the findings on the biopsy showing the invasive cancer, the decision was made to proceed with a mastectomy and sentinel lymph node biopsy  Procedure: The patient was brought to the operating room identified the correct patient.  She was placed upon the operating table and general anesthesia was induced.  Under sterile technique, I injected mag trace underneath the left nipple areolar complex and massaged the breast.  Her left breast and axilla and chest were prepped and draped in the usual sterile fashion.  I then made an elliptical incision transversely across the left breast incorporating the nipple areolar complex.  I then dissected down circumferentially into the breast tissue with electrocautery.  I dissected out the superior skin flap for staying underneath the skin and dermis with the cautery going to the level of the clavicle and down to the chest wall.  I then dissected medially toward the sternum.  I then dissected out the inferior skin flap staying just underneath the skin and dermis dissecting down with the cautery to the inframammary  ridge and then down to the chest wall.  I took the dissection laterally toward the axilla with the cautery again genuine with the mastectomy flaps superior and inferiorly toward the axilla.  Once I completed the flaps I then slowly dissected the breast tissue off the chest wall with the electrocautery again dissecting medial to lateral.  I then completed the total mastectomy laterally past the pectoralis muscles taking the dissection down the chest wall.  I marked to the lateral aspect of the mastectomy with a silk stitch.  The mastectomy specimen was then sent to pathology for evaluation.  Using the mag trace probe I then identified sentinel lymph nodes.  She had several firm, small lymph nodes in the axilla which I excised en bloc using the cautery and surgical clips.  The sentinel lymph nodes were then sent to pathology for evaluation.  There was no further uptake of mag trace in the axilla.  At this point no further enlarged lymph nodes or firm lymph nodes were identified.  I made a separate skin incision and placed a 12 [place] Blake drain into the mastectomy site.  We irrigated the chest wall and flaps with saline.  Hemostasis appeared to be achieved.  I sutured the drain in place with a 2-0 silk suture.  I then closed the incision with interrupted 3-0 Vicryl sutures in a running 4-0 Monocryl suture.  Dermabond was then applied.  The drain was placed to bulb suction.  Gauze was placed over the incision and the patient was placed in a breast binder.  She was then extubated in the operating room and taken in a stable condition to the recovery room.          Bonnie Collins   Date: 12/13/2023  Time: 3:58 PM

## 2023-12-13 NOTE — Anesthesia Procedure Notes (Signed)
 Procedure Name: LMA Insertion Date/Time: 12/13/2023 2:43 PM  Performed by: Debarah Chiquita LABOR, CRNAPre-anesthesia Checklist: Patient identified, Emergency Drugs available, Suction available and Patient being monitored Patient Re-evaluated:Patient Re-evaluated prior to induction Oxygen Delivery Method: Circle system utilized Preoxygenation: Pre-oxygenation with 100% oxygen Induction Type: IV induction Ventilation: Mask ventilation without difficulty LMA: LMA inserted LMA Size: 4.0 Number of attempts: 1 Airway Equipment and Method: Bite block Placement Confirmation: positive ETCO2 Tube secured with: Tape Dental Injury: Teeth and Oropharynx as per pre-operative assessment

## 2023-12-13 NOTE — Anesthesia Procedure Notes (Signed)
 Anesthesia Regional Block: Pectoralis block   Pre-Anesthetic Checklist: , timeout performed,  Correct Patient, Correct Site, Correct Laterality,  Correct Procedure, Correct Position, site marked,  Risks and benefits discussed,  Surgical consent,  Pre-op evaluation,  At surgeon's request and post-op pain management  Laterality: Left  Prep: chloraprep       Needles:  Injection technique: Single-shot  Needle Type: Stimiplex     Needle Length: 9cm  Needle Gauge: 21     Additional Needles:   Procedures:,,,, ultrasound used (permanent image in chart),,    Narrative:  Start time: 12/13/2023 2:25 PM End time: 12/13/2023 2:30 PM Injection made incrementally with aspirations every 5 mL.  Performed by: Personally  Anesthesiologist: Cleotilde Butler Dade, MD

## 2023-12-14 ENCOUNTER — Encounter (HOSPITAL_BASED_OUTPATIENT_CLINIC_OR_DEPARTMENT_OTHER): Payer: Self-pay | Admitting: Surgery

## 2023-12-14 MED ORDER — TRAMADOL HCL 50 MG PO TABS: 50.0000 mg | ORAL_TABLET | Freq: Four times a day (QID) | ORAL | 0 refills | Status: DC | PRN

## 2023-12-14 NOTE — Discharge Summary (Signed)
 Physician Discharge Summary  Patient ID: Bonnie Collins MRN: [id number] DOB/AGE: [birth year] 73 y.o.  Admit date: 12/13/2023 Discharge date: 12/14/2023  Admission Diagnoses: LEFT BREAST CANCER  Discharge Diagnoses:  Active Problems:   * No active hospital problems. * LEFT BREAST CANCER S/P LEFT MASTECTOMY  Discharged Condition: good  Hospital Course: uneventful post op recovery.  Discharged home POD#1  Consults:   Significant Diagnostic Studies:   Treatments: surgery: left total mastectomy and sentinel node biopsy  Discharge Exam: Blood pressure (!) 117/57, pulse (!) 58, temperature 98.3 F (36.8 C), resp. rate 16, height 5' 6 (1.676 m), weight 112.7 kg, last menstrual period 04/08/2003, SpO2 95%. General appearance: alert, cooperative, and no distress Resp: clear to auscultation bilaterally Cardio: regular rate and rhythm, S1, S2 normal, no murmur, click, rub or gallop Incision/Wound: mastectomy flaps viable  Disposition: Discharge disposition: 01-Home or Self Care        Allergies as of 12/14/2023   No Known Allergies      Medication List     TAKE these medications    cholecalciferol 25 MCG (1000 UNIT) tablet Commonly known as: VITAMIN D3 Take 1,000 Units by mouth.   losartan  50 MG tablet Commonly known as: COZAAR  Take 1 tablet by mouth daily.   MULTIVITAMIN PO Take 1 capsule by mouth.   rosuvastatin 10 MG tablet Commonly known as: CRESTOR Take 10 mg by mouth.   traMADol  50 MG tablet Commonly known as: ULTRAM  Take 1 tablet (50 mg total) by mouth every 6 (six) hours as needed for moderate pain (pain score 4-6) or severe pain (pain score 7-10).        Follow-up Information     Vernetta Berg, MD. Schedule an appointment as soon as possible for a visit in 2 week(s).   Specialty: General Surgery Contact information: [address] [place] [address] [phone]                 Signed: Berg Vernetta 12/14/2023,  7:22 AM

## 2023-12-14 NOTE — Discharge Instructions (Addendum)
 [facility] [phone]  MASTECTOMY: POST OP INSTRUCTIONS  Always review your discharge instruction sheet given to you by the facility where your surgery was performed. IF YOU HAVE DISABILITY OR FAMILY LEAVE FORMS, YOU MUST BRING THEM TO THE OFFICE FOR PROCESSING.   DO NOT GIVE THEM TO YOUR DOCTOR. A prescription for pain medication may be given to you upon discharge.  Take your pain medication as prescribed, if needed.  If narcotic pain medicine is not needed, then you may take acetaminophen  (Tylenol ) or ibuprofen (Advil) as needed. Take your usually prescribed medications unless otherwise directed. If you need a refill on your pain medication, please contact your pharmacy.  They will contact our office to request authorization.  Prescriptions will not be filled after 5pm or on week-ends. You should follow a light diet the first few days after arrival home, such as soup and crackers, etc.  Resume your normal diet the day after surgery. Most patients will experience some swelling and bruising on the chest and underarm.  Ice packs will help.  Swelling and bruising can take several days to resolve.  It is common to experience some constipation if taking pain medication after surgery.  Increasing fluid intake and taking a stool softener (such as Colace) will usually help or prevent this problem from occurring.  A mild laxative (Milk of Magnesia or Miralax) should be taken according to package instructions if there are no bowel movements after 48 hours. Unless discharge instructions indicate otherwise, leave your bandage dry and in place until your next appointment in 3-5 days.  You may take a limited sponge bath.  No tube baths or showers until the drains are removed.  You may have steri-strips (small skin tapes) in place directly over the incision.  These strips should be left on the skin for 7-10 days.  If your surgeon used skin glue on the incision, you may shower in 24 hours.   The glue will flake off over the next 2-3 weeks.  Any sutures or staples will be removed at the office during your follow-up visit. DRAINS:  If you have drains in place, it is important to keep a list of the amount of drainage produced each day in your drains.  Before leaving the hospital, you should be instructed on drain care.  Call our office if you have any questions about your drains. ACTIVITIES:  You may resume regular (light) daily activities beginning the next day--such as daily self-care, walking, climbing stairs--gradually increasing activities as tolerated.  You may have sexual intercourse when it is comfortable.  Refrain from any heavy lifting or straining until approved by your doctor. You may drive when you are no longer taking prescription pain medication, you can comfortably wear a seatbelt, and you can safely maneuver your car and apply brakes. RETURN TO WORK:  __________________________________________________________ Bonnie Collins should see your doctor in the office for a follow-up appointment approximately 3-5 days after your surgery.  Your doctor's nurse will typically make your follow-up appointment when she calls you with your pathology report.  Expect your pathology report 2-3 business days after your surgery.  You may call to check if you do not hear from us  after three days.   OTHER INSTRUCTIONS: YOU MAY SHOWER STARTING TOMORROW AND THEN PUT THE BINDER BACK ON RECORD DRAIN OUTPUT ICE PACK, TYLENOL , AND IBUPROFEN ALSO FOR PAIN ______________________________________________________________________________________________ ____________________________________________________________________________________________ WHEN TO CALL YOUR DOCTOR: Fever over 101.0 Nausea and/or vomiting Extreme swelling or bruising Continued bleeding from incision. Increased pain,  redness, or drainage from the incision. The clinic staff is available to answer your questions during regular business hours.  Please  don't hesitate to call and ask to speak to one of the nurses for clinical concerns.  If you have a medical emergency, go to the nearest emergency room or call 911.  A surgeon from [facility] is always on call at the hospital. [address], [address], [place], [place]  [address] ? [address] [address], [place], [place]   [address] [phone] ? [phone] ? FAX [phone]       JP Drain Totals Bring this sheet to all of your post-operative appointments while you have your drains. Please measure your drains by CC's or ML's. Make sure you drain and measure your JP Drains 3 times per day. At the end of each day, add up totals for the left side and add up totals for the right side.    ( 9 am )     ( 3 pm )        ( 9 pm )                Date L  R  L  R  L  R  Total L/R                                                                                                                                                                                           JP Drain Totals Bring this sheet to all of your post-operative appointments while you have your drains. Please measure your drains by CC's or ML's. Make sure you drain and measure your JP Drains 3 times per day. At the end of each day, add up totals for the left side and add up totals for the right side.    ( 9 am )     ( 3 pm )        ( 9 pm )                Date L  R  L  R  L  R  Total L/R

## 2023-12-17 LAB — SURGICAL PATHOLOGY

## 2023-12-18 ENCOUNTER — Ambulatory Visit: Payer: Self-pay | Admitting: Genetic Counselor

## 2023-12-18 ENCOUNTER — Encounter: Payer: Self-pay | Admitting: Physical Therapy

## 2023-12-18 ENCOUNTER — Encounter: Payer: Self-pay | Admitting: *Deleted

## 2023-12-18 DIAGNOSIS — Z1379 Encounter for other screening for genetic and chromosomal anomalies: Secondary | ICD-10-CM

## 2023-12-18 NOTE — Progress Notes (Signed)
 HPI:   Ms. Kotz was previously seen in the [facility] due to a personal and family history of cancer and concerns regarding a hereditary predisposition to cancer. Please refer to our prior cancer genetics clinic note for more information regarding our discussion, assessment and recommendations, at the time. Ms. Nierenberg's recent genetic test results were disclosed to her, as were recommendations warranted by these results. These results and recommendations are discussed in more detail below.  CANCER HISTORY:  Oncology History  Malignant neoplasm of overlapping sites of left breast in female, estrogen receptor positive (HCC)  11/13/2023 Mammogram   Mammogram with asymmetric increase in fibroglandular tissue in left breast which is indeterminate.  No sonographic correlate or abnormality to account for the asymmetric increase in the glandular tissue in the left breast.  Given this can be a subtle presentation of invasive lobular carcinoma contrast-enhanced mammogram is recommended  Contrast-enhanced imaging was successful and without incident this showed scattered non-mass enhancement throughout the left breast, anterior to middle depth.   11/21/2023 Pathology Results   Left breast needle core biopsy at 12:00 4 cm from the nipple showed overall grade 2 invasive mammary carcinoma. Left breast needle core biopsy retroareolar at 4:00 showed overall grade 2 invasive mammary carcinoma prognostic showed ER 95% positive strong staining intensity PR 50% positive staining intensity HER2 0 and Ki-67 of 20%   11/27/2023 Initial Diagnosis   Malignant neoplasm of overlapping sites of left breast in female, estrogen receptor positive (HCC)   11/28/2023 Cancer Staging   Staging form: Breast, AJCC 8th Edition - Clinical stage from 11/28/2023: Stage IB (cT2, cN0, cM0, G2, ER+, PR+, HER2-) - Signed by Loretha Ash, MD on 11/28/2023 Stage prefix: Initial diagnosis Histologic grading system: 3  grade system Laterality: Left Staged by: Pathologist and managing physician Stage used in treatment planning: Yes National guidelines used in treatment planning: Yes Type of national guideline used in treatment planning: NCCN    Genetic Testing   [facility] Cancer[facility] Panel+RNA was Negative. Report date is 12/09/2023.  The [facility] CancerNext+RNAinsight Panel includes sequencing, rearrangement analysis, and RNA analysis for the following 40 genes: APC, ATM, BAP1, BARD1, BMPR1A, BRCA1, BRCA2, BRIP1, CDH1, CDKN2A, CHEK2, FH, FLCN, MET, MLH1, MSH2, MSH6, MUTYH, NF1, NTHL1, PALB2, PMS2, PTEN, RAD51C, RAD51D, RPS20, SMAD4, STK11, TP53, TSC1, TSC2, and VHL (sequencing and deletion/duplication); AXIN2, HOXB13, MBD4, MSH3, POLD1 and POLE (sequencing only); EPCAM and GREM1 (deletion/duplication only).      FAMILY HISTORY:  We obtained a detailed, 4-generation family history.  Significant diagnoses are listed below:      Family History  Problem Relation Age of Onset   Diabetes Mother     Heart disease Mother     Heart disease Father     Heart failure Father     Prostate cancer Brother 57   Lung cancer Paternal Aunt 77        smoked   Multiple myeloma Paternal Uncle     Prostate cancer Maternal Cousin     Colon cancer Neg Hx             Ms. Manzer is unaware of previous family history of genetic testing for hereditary cancer risks. There is no reported Ashkenazi Jewish ancestry.   GENETIC TEST RESULTS:  The [facility] CancerNext Panel found no pathogenic mutations.  The [facility] CancerNext+RNAinsight Panel includes sequencing, rearrangement analysis, and RNA analysis for the following 40 genes: APC, ATM, BAP1, BARD1, BMPR1A, BRCA1, BRCA2, BRIP1, CDH1, CDKN2A, CHEK2, FH, FLCN, MET, MLH1,  MSH2, MSH6, MUTYH, NF1, NTHL1, PALB2, PMS2, PTEN, RAD51C, RAD51D, RPS20, SMAD4, STK11, TP53, TSC1, TSC2, and VHL (sequencing and deletion/duplication); AXIN2, HOXB13, MBD4, MSH3, POLD1 and POLE (sequencing only); EPCAM  and GREM1 (deletion/duplication only).   The test report has been scanned into EPIC and is located under the Molecular Pathology section of the Results Review tab.  A portion of the result report is included below for reference. Genetic testing reported out on 12/09/2023.       Even though a pathogenic variant was not identified, possible explanations for the cancer in the family may include: There may be no hereditary risk for cancer in the family. The cancers in Ms. Novell and/or her family may be due to other genetic or environmental factors. There may be a gene mutation in one of these genes that current testing methods cannot detect, but that chance is small. There could be another gene that has not yet been discovered, or that we have not yet tested, that is responsible for the cancer diagnoses in the family.  It is also possible there is a hereditary cause for the cancer in the family that Ms. Kuklinski did not inherit.  Therefore, it is important to remain in touch with cancer genetics in the future so that we can continue to offer Ms. Hillmann the most up to date genetic testing.   ADDITIONAL GENETIC TESTING:  We discussed with Ms. Reynolds that her genetic testing was fairly extensive.  If there are genes identified to increase cancer risk that can be analyzed in the future, we would be happy to discuss and coordinate this testing at that time.    CANCER SCREENING RECOMMENDATIONS:  Ms. Hanna test result is considered negative (normal).  This means that we have not identified a hereditary cause for her personal and family history of cancer at this time.     An individual's cancer risk and medical management are not determined by genetic test results alone. Overall cancer risk assessment incorporates additional factors, including personal medical history, family history, and any available genetic information that may result in a personalized plan for cancer prevention and surveillance.  Therefore, it is recommended she continue to follow the cancer management and screening guidelines provided by her oncology and primary healthcare provider.  FOLLOW-UP:  Cancer genetics is a rapidly advancing field and it is possible that new genetic tests will be appropriate for her and/or her family members in the future. We encouraged her to remain in contact with cancer genetics on an annual basis so we can update her personal and family histories and let her know of advances in cancer genetics that may benefit this family.   Our contact number was provided. Ms. Shieh's questions were answered to her satisfaction, and she knows she is welcome to call us  at anytime with additional questions or concerns.   Honestie Kulik, MS, [facility] Genetic Counselor [email or web address] (P) [phone]

## 2023-12-24 ENCOUNTER — Inpatient Hospital Stay: Attending: Internal Medicine | Admitting: Hematology and Oncology

## 2023-12-24 VITALS — BP 124/71 | HR 76 | Temp 97.3°F | Resp 19 | Wt 245.6 lb

## 2023-12-24 DIAGNOSIS — Z170 Estrogen receptor positive status [ER+]: Secondary | ICD-10-CM | POA: Insufficient documentation

## 2023-12-24 DIAGNOSIS — Z8042 Family history of malignant neoplasm of prostate: Secondary | ICD-10-CM | POA: Diagnosis not present

## 2023-12-24 DIAGNOSIS — Z8249 Family history of ischemic heart disease and other diseases of the circulatory system: Secondary | ICD-10-CM | POA: Diagnosis not present

## 2023-12-24 DIAGNOSIS — N6489 Other specified disorders of breast: Secondary | ICD-10-CM | POA: Diagnosis not present

## 2023-12-24 DIAGNOSIS — Z923 Personal history of irradiation: Secondary | ICD-10-CM | POA: Insufficient documentation

## 2023-12-24 DIAGNOSIS — Z17411 Hormone receptor positive with human epidermal growth factor receptor 2 negative status: Secondary | ICD-10-CM | POA: Diagnosis not present

## 2023-12-24 DIAGNOSIS — Z9089 Acquired absence of other organs: Secondary | ICD-10-CM | POA: Insufficient documentation

## 2023-12-24 DIAGNOSIS — Z79899 Other long term (current) drug therapy: Secondary | ICD-10-CM | POA: Diagnosis not present

## 2023-12-24 DIAGNOSIS — Z801 Family history of malignant neoplasm of trachea, bronchus and lung: Secondary | ICD-10-CM | POA: Diagnosis not present

## 2023-12-24 DIAGNOSIS — Z807 Family history of other malignant neoplasms of lymphoid, hematopoietic and related tissues: Secondary | ICD-10-CM | POA: Diagnosis not present

## 2023-12-24 DIAGNOSIS — C50812 Malignant neoplasm of overlapping sites of left female breast: Secondary | ICD-10-CM | POA: Insufficient documentation

## 2023-12-24 DIAGNOSIS — Z833 Family history of diabetes mellitus: Secondary | ICD-10-CM | POA: Diagnosis not present

## 2023-12-24 NOTE — Progress Notes (Signed)
 [facility] CONSULT NOTE  Patient Care Team: James, Natalie W, PA-C as PCP - General (Family Medicine) Lonn Hicks, MD as Consulting Physician (Hematology and Oncology) Jannis Kate Norris, MD as Consulting Physician (Obstetrics and Gynecology) Tyree Nanetta SAILOR, RN as Oncology Nurse Navigator Gerome, Devere HERO, RN as Oncology Nurse Navigator Loretha Ash, MD as Consulting Physician (Hematology and Oncology) Shannon Agent, MD as Consulting Physician (Radiation Oncology) Vernetta Berg, MD as Consulting Physician (General Surgery)  CHIEF COMPLAINTS/PURPOSE OF CONSULTATION:  Newly diagnosed breast cancer  HISTORY OF PRESENTING ILLNESS:  Bonnie Collins 73 y.o. female is here because of recent diagnosis of left ILC.  I reviewed her records extensively and collaborated the history with the patient.  SUMMARY OF ONCOLOGIC HISTORY: Oncology History  Malignant neoplasm of overlapping sites of left breast in female, estrogen receptor positive (HCC)  11/13/2023 Mammogram   Mammogram with asymmetric increase in fibroglandular tissue in left breast which is indeterminate.  No sonographic correlate or abnormality to account for the asymmetric increase in the glandular tissue in the left breast.  Given this can be a subtle presentation of invasive lobular carcinoma contrast-enhanced mammogram is recommended  Contrast-enhanced imaging was successful and without incident this showed scattered non-mass enhancement throughout the left breast, anterior to middle depth.   11/21/2023 Pathology Results   Left breast needle core biopsy at 12:00 4 cm from the nipple showed overall grade 2 invasive mammary carcinoma. Left breast needle core biopsy retroareolar at 4:00 showed overall grade 2 invasive mammary carcinoma prognostic showed ER 95% positive strong staining intensity PR 50% positive staining intensity HER2 0 and Ki-67 of 20%   11/27/2023 Initial Diagnosis   Malignant neoplasm of  overlapping sites of left breast in female, estrogen receptor positive (HCC)    Genetic Testing   [facility] Cancer[facility] Panel+RNA was Negative. Report date is 12/09/2023.  The [facility] CancerNext+RNAinsight Panel includes sequencing, rearrangement analysis, and RNA analysis for the following 40 genes: APC, ATM, BAP1, BARD1, BMPR1A, BRCA1, BRCA2, BRIP1, CDH1, CDKN2A, CHEK2, FH, FLCN, MET, MLH1, MSH2, MSH6, MUTYH, NF1, NTHL1, PALB2, PMS2, PTEN, RAD51C, RAD51D, RPS20, SMAD4, STK11, TP53, TSC1, TSC2, and VHL (sequencing and deletion/duplication); AXIN2, HOXB13, MBD4, MSH3, POLD1 and POLE (sequencing only); EPCAM and GREM1 (deletion/duplication only).    11/28/2023 Cancer Staging   Staging form: Breast, AJCC 8th Edition - Pathologic: Stage IB (pT3, pN2a(sn), cM0, G2, ER+, PR+, HER2-) - Signed by Loretha Ash, MD on 12/24/2023 Method of lymph node assessment: Sentinel lymph node biopsy Multigene prognostic tests performed: None Histologic grading system: 3 grade system    Discussed the use of AI scribe software for clinical note transcription with the patient, who gave verbal consent to proceed.  History of Present Illness  Discussed the use of AI scribe software for clinical note transcription with the patient, who gave verbal consent to proceed.  History of Present Illness   Bonnie Collins is a 73 year old female with breast cancer who presents for follow-up after surgery. She is accompanied by her friend.  She recently underwent surgery for breast cancer, with final pathology revealing a tumor size of 6.2 centimeters and negative margins, indicating complete excision. All eight lymph nodes removed were positive for tumor, with largest deposit of 17 millimeters and extracapsular extension present in all nodes.  She is concerned about the possibility of further surgery to remove additional lymph nodes and the need for systemic scans to check for metastasis. She is anxious about the potential for  undetected disease,  She has a history of breast cancer treatment, having undergone a lumpectomy and radiation in 2009. She is worried about the possibility of lymphedema following further lymph node dissection,  She is currently awaiting scheduling for a CT chest, abdomen, pelvis, and bone scan to assess for any metastatic disease.  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; s/p lumpectomy; s/p adj XRT; on adj Tamoxifen    Hypertension    Sinus problem     SURGICAL HISTORY: Past Surgical History:  Procedure Laterality Date   BREAST LUMPECTOMY Left 2009   CARPAL TUNNEL RELEASE Bilateral 1993 & 2003   MASTECTOMY W/ SENTINEL NODE BIOPSY Left 12/13/2023   Procedure: MASTECTOMY WITH SENTINEL LYMPH NODE BIOPSY;  Surgeon: Vernetta Berg, MD;  Location: [facility];  Service: General;  Laterality: Left;  LEFT BREAST MASTECTOMY AND SENTINEL NODE BIOPSY   TONSILLECTOMY  age 9     SOCIAL HISTORY: Social History   Socioeconomic History   Marital status: Single    Spouse name: Not on file   Number of children: 0   Years of education: Not on file   Highest education level: Not on file  Occupational History   Not on file  Tobacco Use   Smoking status: Never   Smokeless tobacco: Never  Vaping Use   Vaping status: Never Used  Substance and Sexual Activity   Alcohol use: Yes    Alcohol/week: 0.0 - 1.0 standard drinks of alcohol    Comment: occasionally   Drug use: No   Sexual activity: Not Currently    Partners: Male    Birth control/protection: Post-menopausal  Other Topics Concern   Not on file  Social History Narrative   Not on file   Social Drivers of Health   Financial Resource Strain: Not on file  Food Insecurity: No Food Insecurity (11/28/2023)   Hunger Vital Sign    Worried About Running Out of Food in the Last Year: Never true    Ran Out of  Food in the Last Year: Never true  Transportation Needs: No Transportation Needs (11/28/2023)   PRAPARE - Administrator, Civil Service (Medical): No    Lack of Transportation (Non-Medical): No  Physical Activity: Not on file  Stress: Not on file  Social Connections: Not on file  Intimate Partner Violence: Not At Risk (11/28/2023)   Humiliation, Afraid, Rape, and Kick questionnaire    Fear of Current or Ex-Partner: No    Emotionally Abused: No    Physically Abused: No    Sexually Abused: No    FAMILY HISTORY: Family History  Problem Relation Age of Onset   Diabetes Mother    Heart disease Mother    Heart disease Father    Heart failure Father    Prostate cancer Brother 78   Lung cancer Paternal Aunt 59       smoked   Multiple myeloma Paternal Uncle    Prostate cancer Maternal Cousin    Colon cancer Neg Hx     ALLERGIES:  has no known allergies.  MEDICATIONS:  Current Outpatient Medications  Medication Sig Dispense Refill   cholecalciferol (VITAMIN D3) 25 MCG (1000 UNIT) tablet Take 1,000 Units by mouth.     losartan  (COZAAR ) 50 MG tablet Take 1 tablet by mouth daily.     Multiple Vitamin (MULTIVITAMIN PO) Take 1 capsule by  mouth.      rosuvastatin (CRESTOR) 10 MG tablet Take 10 mg by mouth. (Patient not taking: Reported on 12/24/2023)     traMADol  (ULTRAM ) 50 MG tablet Take 1 tablet (50 mg total) by mouth every 6 (six) hours as needed for moderate pain (pain score 4-6) or severe pain (pain score 7-10). 20 tablet 0   No current facility-administered medications for this visit.   PHYSICAL EXAMINATION: ECOG PERFORMANCE STATUS: 0 - Asymptomatic  Vitals:   12/24/23 1302  BP: 124/71  Pulse: 76  Resp: 19  Temp: (!) 97.3 F (36.3 C)  SpO2: 97%    Filed Weights   12/24/23 1302  Weight: 245 lb 9.6 oz (111.4 kg)     GENERAL:alert, no distress and comfortable  LABORATORY DATA:  I have reviewed the data as listed Lab Results  Component Value Date   WBC  5.2 11/28/2023   HGB 13.8 11/28/2023   HCT 43.1 11/28/2023   MCV 89.0 11/28/2023   PLT 193 11/28/2023   Lab Results  Component Value Date   NA 141 11/28/2023   K 4.8 11/28/2023   CL 106 11/28/2023   CO2 31 11/28/2023    RADIOGRAPHIC STUDIES: I have personally reviewed the radiological reports and agreed with the findings in the report.  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 Primary tumor 6.2 cm, negative margins. All eight axillary nodes positive with extracapsular extension. Staging, pending systemic scans. High recurrence risk given large tumor and 8/8 pos LN. Previous lumpectomy and radiation in 2009. Re-radiation will be considered. Treatment plan contingent on scan results. - Order CT chest, abdomen, pelvis, and bone scan within 1-2 weeks to assess for metastatic disease. - If no metastatic disease, plan additional lymph node dissection surgery followed by chemotherapy followed by adj irradiation? Followed by anti estrogen therapy with CDK 4/6 inhibitors - If metastatic disease present, consider antiestrogens and CDK 4/6 inhibitors, reserving chemotherapy for later stages. - Schedule follow-up with surgeon for surgical options discussion.   All questions were answered. The patient knows to call the clinic with any problems, questions or concerns.    Amber Stalls, MD 12/24/23

## 2023-12-25 ENCOUNTER — Encounter: Payer: Self-pay | Admitting: *Deleted

## 2023-12-30 NOTE — Therapy (Signed)
 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; s/p lumpectomy; s/p adj XRT; on adj Tamoxifen    Hypertension    Sinus problem    Past Surgical History:  Procedure Laterality Date   BREAST LUMPECTOMY Left 2009   CARPAL TUNNEL RELEASE Bilateral 1993 & 2003   MASTECTOMY W/ SENTINEL NODE BIOPSY Left 12/13/2023   Procedure: MASTECTOMY WITH SENTINEL LYMPH NODE BIOPSY;  Surgeon: Vernetta Berg, MD;  Location: [facility];  Service: General;  Laterality: Left;  LEFT BREAST MASTECTOMY AND SENTINEL NODE BIOPSY   TONSILLECTOMY  age 24    Patient Active Problem List   Diagnosis Date Noted   Genetic testing 12/10/2023   Malignant neoplasm of overlapping sites of left breast in female, estrogen receptor positive (HCC) 11/27/2023   Well woman exam with routine gynecological exam 09/06/2023   Mixed incontinence 09/06/2023   Arthritis of right knee 11/24/2020   Benign neoplasm of skin of face 11/24/2020   Dermatitis 11/24/2020   Primary osteoarthritis of first carpometacarpal joint of right hand 04/07/2020   Rupture of extensor tendon of right hand 04/07/2020   Scapholunate dissociation, right 04/07/2020   Chronic pain of right knee 12/10/2019   Pure hypercholesterolemia 05/14/2018   Hallux valgus 11/25/2015   Reactive airway disease 10/18/2015   Obesity 10/18/2015   Inflamed seborrheic keratosis 10/18/2015   History of breast cancer 10/18/2015   DCIS (ductal carcinoma in situ)     REFERRING PROVIDER: Dr. Berg Vernetta  REFERRING DIAG: Left breast cancer  THERAPY DIAG:  Malignant neoplasm of upper-outer quadrant of left breast in female, estrogen receptor positive (HCC)  Abnormal  posture  Aftercare following surgery for neoplasm  Rationale for Evaluation and Treatment: Rehabilitation  ONSET DATE: 12/13/2023  SUBJECTIVE:                                                                                                                                                                                           SUBJECTIVE STATEMENT: Patient reports she underwent a left mastectomy and axillary lymph node dissection (8 of 8 nodes positive) on 12/13/2023. Final pathology showed a 6.2 cm mass which was grade II invasive lobular carcinoma and ER/PR positive, HER2 negative, with a Ki67 of 20%.  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 She  underwent a left mastectomy and axillary lymph node dissection (8 of 8 nodes positive) on 12/13/2023. Final pathology showed a 6.2 cm mass which was grade II invasive lobular carcinoma and ER/PR positive, HER2 negative, with a Ki67 of 20%.She has a history of left breast cancer DCIS in 2009 treated with a lumpectomy and radiation.   PATIENT GOALS:  Reassess how my recovery is going related to arm function, pain, and swelling.  PAIN:  Are you having pain? {OPRCPAIN:27236}  PRECAUTIONS: Recent Surgery, left UE Lymphedema risk  RED FLAGS: {PT Red Flags:29287}   ACTIVITY LEVEL / LEISURE: Before surgery she reported that she does water aerobics 4x/week    OBJECTIVE:   PATIENT SURVEYS:  QUICK DASH: ***  OBSERVATIONS: ***  POSTURE:  ***  LYMPHEDEMA ASSESSMENT:   UPPER EXTREMITY AROM/PROM:   A/PROM RIGHT   eval    Shoulder extension 44  Shoulder flexion 148  Shoulder abduction 162  Shoulder internal rotation 63  Shoulder external rotation 78                          (Blank rows = not tested)   A/PROM LEFT   eval LEFT 12/31/2023  Shoulder extension 39   Shoulder flexion 137   Shoulder abduction 165   Shoulder internal rotation 63   Shoulder external  rotation 73                           (Blank rows = not tested)   CERVICAL AROM: All within normal limits:      Percent limited  Flexion WNL  Extension WNL  Right lateral flexion 25% limited  Left lateral flexion 25% limited  Right rotation 25% limited  Left rotation 25% limited      UPPER EXTREMITY STRENGTH: WFL   LYMPHEDEMA ASSESSMENTS (in cm):    LANDMARK RIGHT   eval RIGHT 12/31/2023  10 cm proximal to olecranon process 30.2   Olecranon process 25.2   10 cm proximal to ulnar styloid process 22.7   Just proximal to ulnar styloid process 16.1   Across hand at thumb web space 19.8   At base of 2nd digit 6.5   (Blank rows = not tested)   LANDMARK LEFT   eval LEFT 12/31/2023  10 cm proximal to olecranon process 31.9   Olecranon process 25.3   10 cm proximal to ulnar styloid process 21.2   Just proximal to ulnar styloid process 16.1   Across hand at thumb web space 19.3   At base of 2nd digit 6.4   (Blank rows = not tested)  Surgery type/Date: Left mastectomy and axillary lymph node dissection 12/13/2023 Number of lymph nodes removed: 8 Current/past treatment (chemo, radiation, hormone therapy): none Other symptoms:  Heaviness/tightness {yes/no:20286} Pain {yes/no:20286} Pitting edema {yes/no:20286} Infections {yes/no:20286} Decreased scar mobility {yes/no:20286} Stemmer sign {yes/no:20286}  PATIENT EDUCATION:  Education details: *** Person educated: {Person educated:25204} Education method: {Education Method:25205} Education comprehension: {Education Comprehension:25206}  HOME EXERCISE PROGRAM: Reviewed previously given post op HEP. ***  ASSESSMENT:  CLINICAL IMPRESSION: ***  Pt will benefit from skilled therapeutic intervention to improve on the following deficits: Decreased knowledge of precautions, impaired UE functional use, pain, decreased ROM, postural dysfunction.   PT treatment/interventions: ADL/Self care home management, {rehab planned  interventions:25118::97110-Therapeutic exercises,97530- Therapeutic [phone]- Neuromuscular re-education,97535- Self Rjmz,02859- Manual therapy}   GOALS: Goals reviewed with patient? Yes  LONG TERM GOALS:  (STG=LTG)  GOALS Name  Target Date  Goal status  1 Pt will demonstrate she has regained full shoulder ROM and function post operatively compared to baselines.  Baseline: 01/23/2024 INITIAL    PLAN:  PT FREQUENCY/DURATION: ***  PLAN FOR NEXT SESSION: ***   [facility]  [address]  [place] [address]  [phone]  After Breast Cancer Class Video It is recommended you view the ABC class video to be educated on lymphedema risk reduction. This video lasts for about 30 minutes. It can be viewed on our website here: [email or web address]  Scar massage You can begin gentle scar massage to you incision sites. Gently place one hand on the incision and move the skin (without sliding on the skin) in various directions. Do this for a few minutes and then you can gently massage either coconut oil or vitamin E cream into the scars.  Compression garment You should continue wearing your compression bra until you feel like you no longer have swelling.  Home exercise Program Continue doing the exercises you were given until you feel like you can do them without feeling any tightness at the end.   Walking Program Studies show that 30 minutes of walking per day (fast enough to elevate your heart rate) can significantly reduce the risk of a cancer recurrence. If you can't walk due to other medical reasons, we encourage you to find another activity you could do (like a stationary bike or water exercise).  Posture After breast cancer surgery, people frequently sit with rounded shoulders posture because it puts their incisions on slack and feels better. If you sit like this and  scar tissue forms in that position, you can become very tight and have pain sitting or standing with good posture. Try to be aware of your posture and sit and stand up tall to heal properly.  Follow up PT: It is recommended you return every 3 months for the first 3 years following surgery to be assessed on the SOZO machine for an L-Dex score. This helps prevent clinically significant lymphedema in 95% of patients. These follow up screens are 10 minute appointments that you are not billed for.  Tawni Melkonian,MARTI COOPER, PT 12/30/2023, 6:24 PM

## 2023-12-31 ENCOUNTER — Other Ambulatory Visit: Payer: Self-pay

## 2023-12-31 ENCOUNTER — Encounter: Payer: Self-pay | Admitting: Physical Therapy

## 2023-12-31 ENCOUNTER — Ambulatory Visit: Attending: Surgery | Admitting: Physical Therapy

## 2023-12-31 ENCOUNTER — Ambulatory Visit (HOSPITAL_COMMUNITY)
Admission: RE | Admit: 2023-12-31 | Discharge: 2023-12-31 | Disposition: A | Discharge status: Home / Self Care | Source: Ambulatory Visit | Attending: Hematology and Oncology | Admitting: Hematology and Oncology

## 2023-12-31 DIAGNOSIS — R293 Abnormal posture: Secondary | ICD-10-CM | POA: Diagnosis present

## 2023-12-31 DIAGNOSIS — Z483 Aftercare following surgery for neoplasm: Secondary | ICD-10-CM | POA: Diagnosis present

## 2023-12-31 DIAGNOSIS — C50412 Malignant neoplasm of upper-outer quadrant of left female breast: Secondary | ICD-10-CM | POA: Diagnosis present

## 2023-12-31 DIAGNOSIS — Z170 Estrogen receptor positive status [ER+]: Secondary | ICD-10-CM | POA: Insufficient documentation

## 2023-12-31 DIAGNOSIS — C50812 Malignant neoplasm of overlapping sites of left female breast: Secondary | ICD-10-CM | POA: Diagnosis present

## 2023-12-31 MED ORDER — IOHEXOL 300 MG/ML  SOLN
100.0000 mL | Freq: Once | INTRAMUSCULAR | Status: AC | PRN
  Administered 2023-12-31: 100 mL via INTRAVENOUS

## 2023-12-31 NOTE — Patient Instructions (Signed)
 [facility]  [address]  [place] [address]  [phone]  After Breast Cancer Class Video It is recommended you view the ABC class video to be educated on lymphedema risk reduction. This video lasts for about 30 minutes. It can be viewed on our website here: [email or web address]  Scar massage You can begin gentle scar massage to you incision sites ONCE YOUR GLUE IS ALL GONE. Gently place one hand on the incision and move the skin (without sliding on the skin) in various directions. Do this for a few minutes and then you can gently massage either coconut oil or vitamin E cream into the scars.  Compression garment You should continue wearing your compression bra until you feel like you no longer have swelling.  Home exercise Program Continue doing the exercises you were given until you feel like you can do them without feeling any tightness at the end.   Walking Program Studies show that 30 minutes of walking per day (fast enough to elevate your heart rate) can significantly reduce the risk of a cancer recurrence. If you can't walk due to other medical reasons, we encourage you to find another activity you could do (like a stationary bike or water exercise).  Posture After breast cancer surgery, people frequently sit with rounded shoulders posture because it puts their incisions on slack and feels better. If you sit like this and scar tissue forms in that position, you can become very tight and have pain sitting or standing with good posture. Try to be aware of your posture and sit and stand up tall to heal properly.  Follow up PT: It is recommended you return every 3 months for the first 3 years following surgery to be assessed on the SOZO machine for an L-Dex score. This helps prevent clinically significant lymphedema in 95% of patients. These follow up screens are 10 minute appointments  that you are not billed for.

## 2024-01-02 ENCOUNTER — Telehealth: Payer: Self-pay | Admitting: *Deleted

## 2024-01-02 NOTE — Telephone Encounter (Signed)
 Called reading room for CT scan results. Priority has been moved up

## 2024-01-03 ENCOUNTER — Ambulatory Visit (HOSPITAL_COMMUNITY)
Admission: RE | Admit: 2024-01-03 | Discharge: 2024-01-03 | Disposition: A | Discharge status: Home / Self Care | Source: Ambulatory Visit | Attending: Hematology and Oncology | Admitting: Hematology and Oncology

## 2024-01-03 ENCOUNTER — Encounter (HOSPITAL_COMMUNITY)
Admission: RE | Admit: 2024-01-03 | Discharge: 2024-01-03 | Disposition: A | Discharge status: Home / Self Care | Source: Ambulatory Visit | Attending: Hematology and Oncology | Admitting: Hematology and Oncology

## 2024-01-03 DIAGNOSIS — Z170 Estrogen receptor positive status [ER+]: Secondary | ICD-10-CM | POA: Insufficient documentation

## 2024-01-03 DIAGNOSIS — C50812 Malignant neoplasm of overlapping sites of left female breast: Secondary | ICD-10-CM | POA: Insufficient documentation

## 2024-01-03 MED ORDER — TECHNETIUM TC 99M MEDRONATE IV KIT
22.0000 | PACK | Freq: Once | INTRAVENOUS | Status: AC | PRN
  Administered 2024-01-03: 22 via INTRAVENOUS

## 2024-01-04 ENCOUNTER — Telehealth: Payer: Self-pay | Admitting: *Deleted

## 2024-01-04 NOTE — Telephone Encounter (Signed)
 Called reading room for bone scan results

## 2024-01-08 ENCOUNTER — Inpatient Hospital Stay: Admitting: Hematology and Oncology

## 2024-01-08 ENCOUNTER — Inpatient Hospital Stay: Attending: Internal Medicine | Admitting: Hematology and Oncology

## 2024-01-08 VITALS — BP 150/86 | HR 93 | Temp 98.2°F | Resp 20 | Wt 245.5 lb

## 2024-01-08 DIAGNOSIS — Z5111 Encounter for antineoplastic chemotherapy: Secondary | ICD-10-CM | POA: Diagnosis present

## 2024-01-08 DIAGNOSIS — C773 Secondary and unspecified malignant neoplasm of axilla and upper limb lymph nodes: Secondary | ICD-10-CM | POA: Diagnosis not present

## 2024-01-08 DIAGNOSIS — Z807 Family history of other malignant neoplasms of lymphoid, hematopoietic and related tissues: Secondary | ICD-10-CM | POA: Diagnosis not present

## 2024-01-08 DIAGNOSIS — Z79899 Other long term (current) drug therapy: Secondary | ICD-10-CM | POA: Insufficient documentation

## 2024-01-08 DIAGNOSIS — R509 Fever, unspecified: Secondary | ICD-10-CM | POA: Diagnosis not present

## 2024-01-08 DIAGNOSIS — Z17411 Hormone receptor positive with human epidermal growth factor receptor 2 negative status: Secondary | ICD-10-CM | POA: Diagnosis not present

## 2024-01-08 DIAGNOSIS — Z8042 Family history of malignant neoplasm of prostate: Secondary | ICD-10-CM | POA: Insufficient documentation

## 2024-01-08 DIAGNOSIS — K5900 Constipation, unspecified: Secondary | ICD-10-CM | POA: Diagnosis not present

## 2024-01-08 DIAGNOSIS — Z170 Estrogen receptor positive status [ER+]: Secondary | ICD-10-CM | POA: Insufficient documentation

## 2024-01-08 DIAGNOSIS — K760 Fatty (change of) liver, not elsewhere classified: Secondary | ICD-10-CM | POA: Insufficient documentation

## 2024-01-08 DIAGNOSIS — G629 Polyneuropathy, unspecified: Secondary | ICD-10-CM | POA: Diagnosis not present

## 2024-01-08 DIAGNOSIS — Z79633 Long term (current) use of mitotic inhibitor: Secondary | ICD-10-CM | POA: Insufficient documentation

## 2024-01-08 DIAGNOSIS — Z9012 Acquired absence of left breast and nipple: Secondary | ICD-10-CM | POA: Insufficient documentation

## 2024-01-08 DIAGNOSIS — Z801 Family history of malignant neoplasm of trachea, bronchus and lung: Secondary | ICD-10-CM | POA: Insufficient documentation

## 2024-01-08 DIAGNOSIS — C50812 Malignant neoplasm of overlapping sites of left female breast: Secondary | ICD-10-CM | POA: Insufficient documentation

## 2024-01-08 DIAGNOSIS — T451X5A Adverse effect of antineoplastic and immunosuppressive drugs, initial encounter: Secondary | ICD-10-CM | POA: Diagnosis not present

## 2024-01-08 DIAGNOSIS — Z79630 Long term (current) use of alkylating agent: Secondary | ICD-10-CM | POA: Insufficient documentation

## 2024-01-08 DIAGNOSIS — Z923 Personal history of irradiation: Secondary | ICD-10-CM | POA: Diagnosis not present

## 2024-01-08 DIAGNOSIS — Z86018 Personal history of other benign neoplasm: Secondary | ICD-10-CM | POA: Insufficient documentation

## 2024-01-08 DIAGNOSIS — Z833 Family history of diabetes mellitus: Secondary | ICD-10-CM | POA: Insufficient documentation

## 2024-01-08 DIAGNOSIS — R110 Nausea: Secondary | ICD-10-CM | POA: Insufficient documentation

## 2024-01-08 DIAGNOSIS — Z8249 Family history of ischemic heart disease and other diseases of the circulatory system: Secondary | ICD-10-CM | POA: Diagnosis not present

## 2024-01-08 DIAGNOSIS — M898X9 Other specified disorders of bone, unspecified site: Secondary | ICD-10-CM | POA: Insufficient documentation

## 2024-01-08 DIAGNOSIS — R918 Other nonspecific abnormal finding of lung field: Secondary | ICD-10-CM | POA: Diagnosis not present

## 2024-01-08 NOTE — Progress Notes (Signed)
 [facility] CONSULT NOTE  Patient Care Team: James, Natalie W, PA-C as PCP - General (Family Medicine) Lonn Hicks, MD as Consulting Physician (Hematology and Oncology) Jannis Kate Norris, MD as Consulting Physician (Obstetrics and Gynecology) Tyree Nanetta SAILOR, RN as Oncology Nurse Navigator Gerome, Devere HERO, RN as Oncology Nurse Navigator Loretha Ash, MD as Consulting Physician (Hematology and Oncology) Shannon Agent, MD as Consulting Physician (Radiation Oncology) Vernetta Berg, MD as Consulting Physician (General Surgery)  CHIEF COMPLAINTS/PURPOSE OF CONSULTATION:  Newly diagnosed breast cancer  HISTORY OF PRESENTING ILLNESS:  Bonnie Collins 73 y.o. female is here because of recent diagnosis of left ILC.  I reviewed her records extensively and collaborated the history with the patient.  SUMMARY OF ONCOLOGIC HISTORY: Oncology History  Malignant neoplasm of overlapping sites of left breast in female, estrogen receptor positive (HCC)  11/13/2023 Mammogram   Mammogram with asymmetric increase in fibroglandular tissue in left breast which is indeterminate.  No sonographic correlate or abnormality to account for the asymmetric increase in the glandular tissue in the left breast.  Given this can be a subtle presentation of invasive lobular carcinoma contrast-enhanced mammogram is recommended  Contrast-enhanced imaging was successful and without incident this showed scattered non-mass enhancement throughout the left breast, anterior to middle depth.   11/21/2023 Pathology Results   Left breast needle core biopsy at 12:00 4 cm from the nipple showed overall grade 2 invasive mammary carcinoma. Left breast needle core biopsy retroareolar at 4:00 showed overall grade 2 invasive mammary carcinoma prognostic showed ER 95% positive strong staining intensity PR 50% positive staining intensity HER2 0 and Ki-67 of 20%   11/27/2023 Initial Diagnosis   Malignant neoplasm of  overlapping sites of left breast in female, estrogen receptor positive (HCC)    Genetic Testing   [facility] CancerNext Panel+RNA was Negative. Report date is 12/09/2023.  The [facility] CancerNext+RNAinsight Panel includes sequencing, rearrangement analysis, and RNA analysis for the following 40 genes: APC, ATM, BAP1, BARD1, BMPR1A, BRCA1, BRCA2, BRIP1, CDH1, CDKN2A, CHEK2, FH, FLCN, MET, MLH1, MSH2, MSH6, MUTYH, NF1, NTHL1, PALB2, PMS2, PTEN, RAD51C, RAD51D, RPS20, SMAD4, STK11, TP53, TSC1, TSC2, and VHL (sequencing and deletion/duplication); AXIN2, HOXB13, MBD4, MSH3, POLD1 and POLE (sequencing only); EPCAM and GREM1 (deletion/duplication only).    11/28/2023 Cancer Staging   Staging form: Breast, AJCC 8th Edition - Pathologic: Stage IB (pT3, pN2a(sn), cM0, G2, ER+, PR+, HER2-) - Signed by Loretha Ash, MD on 12/24/2023 Method of lymph node assessment: Sentinel lymph node biopsy Multigene prognostic tests performed: None Histologic grading system: 3 grade system    Discussed the use of AI scribe software for clinical note transcription with the patient, who gave verbal consent to proceed.  History of Present Illness  Discussed the use of AI scribe software for clinical note transcription with the patient, who gave verbal consent to proceed.  History of Present Illness Bonnie Collins is a 73 year old female with breast cancer who presents for a follow-up regarding chemotherapy options. She is accompanied by her sister, Bonnie Collins.  She is currently evaluating chemotherapy options following her recent surgery on December 13, 2023. Lung nodules have been identified, but no definitive metastatic disease is present in the abdomen or pelvis. She has a history of fatty liver and uterine fibroids, which are unrelated to her breast cancer.  She maintains good functional status, performing daily activities without significant limitations. She has never experienced major health problems and considers herself  fairly healthy.  She is considering different chemotherapy  regimens, including ACT and TC. Potential side effects of chemotherapy include fatigue, nausea, cardiac toxicity, and the risk of secondary cancers.   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; s/p lumpectomy; s/p adj XRT; on adj Tamoxifen    Hypertension    Sinus problem     SURGICAL HISTORY: Past Surgical History:  Procedure Laterality Date   BREAST LUMPECTOMY Left 2009   CARPAL TUNNEL RELEASE Bilateral 1993 & 2003   MASTECTOMY W/ SENTINEL NODE BIOPSY Left 12/13/2023   Procedure: MASTECTOMY WITH SENTINEL LYMPH NODE BIOPSY;  Surgeon: Vernetta Berg, MD;  Location: [facility];  Service: General;  Laterality: Left;  LEFT BREAST MASTECTOMY AND SENTINEL NODE BIOPSY   TONSILLECTOMY  age 2     SOCIAL HISTORY: Social History   Socioeconomic History   Marital status: Single    Spouse name: Not on file   Number of children: 0   Years of education: Not on file   Highest education level: Not on file  Occupational History   Not on file  Tobacco Use   Smoking status: Never   Smokeless tobacco: Never  Vaping Use   Vaping status: Never Used  Substance and Sexual Activity   Alcohol use: Yes    Alcohol/week: 0.0 - 1.0 standard drinks of alcohol    Comment: occasionally   Drug use: No   Sexual activity: Not Currently    Partners: Male    Birth control/protection: Post-menopausal  Other Topics Concern   Not on file  Social History Narrative   Not on file   Social Drivers of Health   Financial Resource Strain: Not on file  Food Insecurity: No Food Insecurity (11/28/2023)   Hunger Vital Sign    Worried About Running Out of Food in the Last Year: Never true    Ran Out of Food in the Last Year: Never true  Transportation Needs: No Transportation Needs (11/28/2023)   PRAPARE - Therapist, art (Medical): No    Lack of Transportation (Non-Medical): No  Physical Activity: Not on file  Stress: Not on file  Social Connections: Not on file  Intimate Partner Violence: Not At Risk (11/28/2023)   Humiliation, Afraid, Rape, and Kick questionnaire    Fear of Current or Ex-Partner: No    Emotionally Abused: No    Physically Abused: No    Sexually Abused: No    FAMILY HISTORY: Family History  Problem Relation Age of Onset   Diabetes Mother    Heart disease Mother    Heart disease Father    Heart failure Father    Prostate cancer Brother 53   Lung cancer Paternal Aunt 85       smoked   Multiple myeloma Paternal Uncle    Prostate cancer Maternal Cousin    Colon cancer Neg Hx     ALLERGIES:  has no known allergies.  MEDICATIONS:  Current Outpatient Medications  Medication Sig Dispense Refill   cholecalciferol (VITAMIN D3) 25 MCG (1000 UNIT) tablet Take 1,000 Units by mouth.     losartan  (COZAAR ) 50 MG tablet Take 1 tablet by mouth daily.     Multiple Vitamin (MULTIVITAMIN PO) Take 1 capsule by mouth.      rosuvastatin (CRESTOR) 10 MG tablet Take 10 mg by mouth.     traMADol  (ULTRAM ) 50 MG tablet Take 1 tablet (  50 mg total) by mouth every 6 (six) hours as needed for moderate pain (pain score 4-6) or severe pain (pain score 7-10). 20 tablet 0   No current facility-administered medications for this visit.   PHYSICAL EXAMINATION: ECOG PERFORMANCE STATUS: 0 - Asymptomatic  Vitals:   01/08/24 0855  BP: (!) 163/80  Pulse: 93  Resp: 20  Temp: 98.2 F (36.8 C)  SpO2: 97%    Filed Weights   01/08/24 0855  Weight: 245 lb 8 oz (111.4 kg)     GENERAL:alert, no distress and comfortable  LABORATORY DATA:  I have reviewed the data as listed Lab Results  Component Value Date   WBC 5.2 11/28/2023   HGB 13.8 11/28/2023   HCT 43.1 11/28/2023   MCV 89.0 11/28/2023   PLT 193 11/28/2023   Lab Results  Component Value Date   NA 141 11/28/2023   K  4.8 11/28/2023   CL 106 11/28/2023   CO2 31 11/28/2023    RADIOGRAPHIC STUDIES: I have personally reviewed the radiological reports and agreed with the findings in the report.  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 Primary tumor 6.2 cm, negative margins. All eight axillary nodes positive with extracapsular extension. Staging, pending systemic scans. High recurrence risk given large tumor and 8/8 pos LN. Previous lumpectomy and radiation in 2009. Re-radiation will be considered. Treatment plan contingent on scan results.  Assessment and Plan Assessment & Plan  Breast cancer with multiple positive axillary lymph nodes, post-surgery No metastatic disease on CT. Chemotherapy, radiation, and anti-estrogen therapy planned. ACT regimen more effective but with higher side effects than TC regimen. Emphasized anti-estrogen therapy for up to 10 years. Discussed chemotherapy side effects and shared decision-making based on preferences and quality of life. ACT regimen requires port and echocardiogram. - Discuss chemotherapy options and decide on regimen by Friday. - Schedule chemotherapy class with pharmacist and chemo nurses. - If ACT regimen chosen, arrange for port placement and echocardiogram. - Start chemotherapy within the next couple of weeks. - Begin anti-estrogen and CDK 4/6  therapy after chemotherapy. I would prefer anti estrogens to start immediately after chemotherapy and start CDK 4/6 inhibition after radiation. - Plan for radiation therapy post-chemotherapy.  Lung nodules, likely benign 4 mm lung nodules on CT, likely benign,  No metastatic disease in abdomen or pelvis. Bone scan negative.   All questions were answered. The patient knows to call the clinic with any problems, questions or concerns.     Amber Stalls, MD 01/08/24

## 2024-01-09 ENCOUNTER — Encounter: Payer: Self-pay | Admitting: *Deleted

## 2024-01-11 ENCOUNTER — Inpatient Hospital Stay: Admitting: Hematology and Oncology

## 2024-01-11 DIAGNOSIS — Z170 Estrogen receptor positive status [ER+]: Secondary | ICD-10-CM | POA: Diagnosis not present

## 2024-01-11 DIAGNOSIS — C50812 Malignant neoplasm of overlapping sites of left female breast: Secondary | ICD-10-CM

## 2024-01-11 NOTE — Progress Notes (Signed)
 [facility] CONSULT NOTE  Patient Care Team: James, Natalie W, PA-C as PCP - General (Family Medicine) Lonn Hicks, MD as Consulting Physician (Hematology and Oncology) Jannis Kate Norris, MD as Consulting Physician (Obstetrics and Gynecology) Tyree Nanetta SAILOR, RN as Oncology Nurse Navigator Gerome, Devere HERO, RN as Oncology Nurse Navigator Loretha Ash, MD as Consulting Physician (Hematology and Oncology) Shannon Agent, MD as Consulting Physician (Radiation Oncology) Vernetta Berg, MD as Consulting Physician (General Surgery)  CHIEF COMPLAINTS/PURPOSE OF CONSULTATION:  Newly diagnosed breast cancer  HISTORY OF PRESENTING ILLNESS:  Bonnie Collins 73 y.o. female is here because of recent diagnosis of left ILC.  I reviewed her records extensively and collaborated the history with the patient.  SUMMARY OF ONCOLOGIC HISTORY: Oncology History  Malignant neoplasm of overlapping sites of left breast in female, estrogen receptor positive (HCC)  11/13/2023 Mammogram   Mammogram with asymmetric increase in fibroglandular tissue in left breast which is indeterminate.  No sonographic correlate or abnormality to account for the asymmetric increase in the glandular tissue in the left breast.  Given this can be a subtle presentation of invasive lobular carcinoma contrast-enhanced mammogram is recommended  Contrast-enhanced imaging was successful and without incident this showed scattered non-mass enhancement throughout the left breast, anterior to middle depth.   11/21/2023 Pathology Results   Left breast needle core biopsy at 12:00 4 cm from the nipple showed overall grade 2 invasive mammary carcinoma. Left breast needle core biopsy retroareolar at 4:00 showed overall grade 2 invasive mammary carcinoma prognostic showed ER 95% positive strong staining intensity PR 50% positive staining intensity HER2 0 and Ki-67 of 20%   11/27/2023 Initial Diagnosis   Malignant neoplasm of  overlapping sites of left breast in female, estrogen receptor positive (HCC)    Genetic Testing   [facility] CancerNext Panel+RNA was Negative. Report date is 12/09/2023.  The [facility] CancerNext+RNAinsight Panel includes sequencing, rearrangement analysis, and RNA analysis for the following 40 genes: APC, ATM, BAP1, BARD1, BMPR1A, BRCA1, BRCA2, BRIP1, CDH1, CDKN2A, CHEK2, FH, FLCN, MET, MLH1, MSH2, MSH6, MUTYH, NF1, NTHL1, PALB2, PMS2, PTEN, RAD51C, RAD51D, RPS20, SMAD4, STK11, TP53, TSC1, TSC2, and VHL (sequencing and deletion/duplication); AXIN2, HOXB13, MBD4, MSH3, POLD1 and POLE (sequencing only); EPCAM and GREM1 (deletion/duplication only).    11/28/2023 Cancer Staging   Staging form: Breast, AJCC 8th Edition - Pathologic: Stage IB (pT3, pN2a(sn), cM0, G2, ER+, PR+, HER2-) - Signed by Loretha Ash, MD on 12/24/2023 Method of lymph node assessment: Sentinel lymph node biopsy Multigene prognostic tests performed: None Histologic grading system: 3 grade system    Discussed the use of AI scribe software for clinical note transcription with the patient, who gave verbal consent to proceed.  History of Present Illness  Discussed the use of AI scribe software for clinical note transcription with the patient, who gave verbal consent to proceed.  History of Present Illness Bonnie Collins is a 72 year old female with breast cancer who presents for a follow-up regarding chemotherapy options. She had a chance to think about this. She said she wanted to proceed with chemotherapy but would like to proceed with non anthracycline chemotherapy regimen. She says she doesn't feel like she is in the best shape to  Tolerate an extensive regimen. She says she is also very worried about cardiotoxicity.  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; s/p lumpectomy;  s/p adj XRT; on adj Tamoxifen     Hypertension    Sinus problem     SURGICAL HISTORY: Past Surgical History:  Procedure Laterality Date   BREAST LUMPECTOMY Left 2009   CARPAL TUNNEL RELEASE Bilateral 1993 & 2003   MASTECTOMY W/ SENTINEL NODE BIOPSY Left 12/13/2023   Procedure: MASTECTOMY WITH SENTINEL LYMPH NODE BIOPSY;  Surgeon: Vernetta Berg, MD;  Location: [facility];  Service: General;  Laterality: Left;  LEFT BREAST MASTECTOMY AND SENTINEL NODE BIOPSY   TONSILLECTOMY  age 76     SOCIAL HISTORY: Social History   Socioeconomic History   Marital status: Single    Spouse name: Not on file   Number of children: 0   Years of education: Not on file   Highest education level: Not on file  Occupational History   Not on file  Tobacco Use   Smoking status: Never   Smokeless tobacco: Never  Vaping Use   Vaping status: Never Used  Substance and Sexual Activity   Alcohol use: Yes    Alcohol/week: 0.0 - 1.0 standard drinks of alcohol    Comment: occasionally   Drug use: No   Sexual activity: Not Currently    Partners: Male    Birth control/protection: Post-menopausal  Other Topics Concern   Not on file  Social History Narrative   Not on file   Social Drivers of Health   Financial Resource Strain: Not on file  Food Insecurity: No Food Insecurity (11/28/2023)   Hunger Vital Sign    Worried About Running Out of Food in the Last Year: Never true    Ran Out of Food in the Last Year: Never true  Transportation Needs: No Transportation Needs (11/28/2023)   PRAPARE - Administrator, Civil Service (Medical): No    Lack of Transportation (Non-Medical): No  Physical Activity: Not on file  Stress: Not on file  Social Connections: Not on file  Intimate Partner Violence: Not At Risk (11/28/2023)   Humiliation, Afraid, Rape, and Kick questionnaire    Fear of Current or Ex-Partner: No    Emotionally Abused: No    Physically Abused: No    Sexually Abused: No    FAMILY HISTORY: Family  History  Problem Relation Age of Onset   Diabetes Mother    Heart disease Mother    Heart disease Father    Heart failure Father    Prostate cancer Brother 50   Lung cancer Paternal Aunt 40       smoked   Multiple myeloma Paternal Uncle    Prostate cancer Maternal Cousin    Colon cancer Neg Hx     ALLERGIES:  has no known allergies.  MEDICATIONS:  Current Outpatient Medications  Medication Sig Dispense Refill   cholecalciferol (VITAMIN D3) 25 MCG (1000 UNIT) tablet Take 1,000 Units by mouth.     losartan  (COZAAR ) 50 MG tablet Take 1 tablet by mouth daily.     Multiple Vitamin (MULTIVITAMIN PO) Take 1 capsule by mouth.      rosuvastatin (CRESTOR) 10 MG tablet Take 10 mg by mouth.     traMADol  (ULTRAM ) 50 MG tablet Take 1 tablet (50 mg total) by mouth every 6 (six) hours as needed for moderate pain (pain score 4-6) or severe pain (pain score 7-10). 20 tablet 0   No current facility-administered medications for this visit.   PHYSICAL EXAMINATION: ECOG PERFORMANCE STATUS: 0 - Asymptomatic  There were no vitals filed for this visit.  There were no vitals filed for this visit.    Telephone visit.  LABORATORY DATA:  I have reviewed the data as listed Lab Results  Component Value Date   WBC 5.2 11/28/2023   HGB 13.8 11/28/2023   HCT 43.1 11/28/2023   MCV 89.0 11/28/2023   PLT 193 11/28/2023   Lab Results  Component Value Date   NA 141 11/28/2023   K 4.8 11/28/2023   CL 106 11/28/2023   CO2 31 11/28/2023    RADIOGRAPHIC STUDIES: I have personally reviewed the radiological reports and agreed with the findings in the report.  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 Primary tumor 6.2 cm, negative margins. All eight axillary nodes positive with extracapsular extension. Staging, pending  systemic scans. High recurrence risk given large tumor and 8/8 pos LN. Previous lumpectomy and radiation in 2009. Re-radiation will be considered. Treatment plan contingent on scan results.  Assessment and Plan Assessment & Plan  Breast cancer with multiple positive axillary lymph nodes, post-surgery No metastatic disease on CT. Chemotherapy, radiation, and anti-estrogen therapy planned. ACT regimen more effective but with higher side effects than TC regimen. Emphasized anti-estrogen therapy for up to 10 years. Discussed chemotherapy side effects and shared decision-making based on preferences and quality of life.  She understands the benefits of anthracyclines, but will prefer to go with TC times 4. We will arrange for chemo class. Chemotherapy plan placed. She doesn't have a port , prefers peripheral IV access. RTC before planned cycle1 of chemotherapy.  Lung nodules, likely benign 4 mm lung nodules on CT, likely benign,  No metastatic disease in abdomen or pelvis. Bone scan negative.  Time spent: 10 min  I connected with  Bonnie Collins on 01/11/24 by a telephone application and verified that I am speaking with the correct person using two identifiers.   I discussed the limitations of evaluation and management by telemedicine. The patient expressed understanding and agreed to proceed.  Location of pt: Home Location of provider: office.  All questions were answered. The patient knows to call the clinic with any problems, questions or concerns.    Amber Stalls, MD 01/11/24

## 2024-01-11 NOTE — Progress Notes (Signed)
 START ON PATHWAY REGIMEN - Breast     A cycle is every 21 days:     Cyclophosphamide      Docetaxel   **Always confirm dose/schedule in your pharmacy ordering system**  Patient Characteristics: Postoperative without Neoadjuvant Therapy, M0 (Pathologic Staging), Invasive Disease, Adjuvant Therapy, HER2 Negative, ER Positive, Node Positive, Node Positive (4+) Therapeutic Status: Postoperative without Neoadjuvant Therapy, M0 (Pathologic Staging) AJCC Grade: G2 AJCC N Category: pN2 AJCC M Category: cM0 ER Status: Positive (+) AJCC 8 Stage Grouping: IB HER2 Status: Negative (-) Oncotype Dx Recurrence Score: Not Appropriate AJCC T Category: pT3 PR Status: Positive (+) Intent of Therapy: Curative Intent, Discussed with Patient

## 2024-01-12 ENCOUNTER — Other Ambulatory Visit: Payer: Self-pay

## 2024-01-14 ENCOUNTER — Encounter: Payer: Self-pay | Admitting: *Deleted

## 2024-01-16 ENCOUNTER — Encounter: Payer: Self-pay | Admitting: Physical Therapy

## 2024-01-18 NOTE — Progress Notes (Signed)
 Pharmacist Chemotherapy Monitoring - Initial Assessment    Anticipated start date: 01/25/2024   The following has been reviewed per standard work regarding the patient's treatment regimen: The patient's diagnosis, treatment plan and drug doses, and organ/hematologic function Lab orders and baseline tests specific to treatment regimen  The treatment plan start date, drug sequencing, and pre-medications Prior authorization status  Patient's documented medication list, including drug-drug interaction screen and prescriptions for anti-emetics and supportive care specific to the treatment regimen The drug concentrations, fluid compatibility, administration routes, and timing of the medications to be used The patient's access for treatment and lifetime cumulative dose history, if applicable  The patient's medication allergies and previous infusion related reactions, if applicable   Changes made to treatment plan:  N/A  Follow up needed:  Pending authorization for treatment  F/U home antiemetics   Marlee Eleanor Neighbors, RPH, 01/18/2024  2:10 PM

## 2024-01-21 ENCOUNTER — Encounter: Payer: Self-pay | Admitting: *Deleted

## 2024-01-22 ENCOUNTER — Other Ambulatory Visit: Payer: Self-pay | Admitting: *Deleted

## 2024-01-22 DIAGNOSIS — Z170 Estrogen receptor positive status [ER+]: Secondary | ICD-10-CM

## 2024-01-23 ENCOUNTER — Inpatient Hospital Stay (HOSPITAL_BASED_OUTPATIENT_CLINIC_OR_DEPARTMENT_OTHER): Admitting: Hematology and Oncology

## 2024-01-23 ENCOUNTER — Inpatient Hospital Stay: Admitting: Pharmacist

## 2024-01-23 ENCOUNTER — Inpatient Hospital Stay

## 2024-01-23 VITALS — BP 155/83 | HR 77 | Temp 98.2°F | Resp 20 | Wt 247.8 lb

## 2024-01-23 DIAGNOSIS — C50812 Malignant neoplasm of overlapping sites of left female breast: Secondary | ICD-10-CM | POA: Diagnosis not present

## 2024-01-23 DIAGNOSIS — Z170 Estrogen receptor positive status [ER+]: Secondary | ICD-10-CM

## 2024-01-23 LAB — CMP (CANCER CENTER ONLY)
ALT: 10 U/L (ref 0–44)
AST: 13 U/L — ABNORMAL LOW (ref 15–41)
Albumin: 4 g/dL (ref 3.5–5.0)
Alkaline Phosphatase: 67 U/L (ref 38–126)
Anion gap: 4 — ABNORMAL LOW (ref 5–15)
BUN: 13 mg/dL (ref 8–23)
CO2: 31 mmol/L (ref 22–32)
Calcium: 9.1 mg/dL (ref 8.9–10.3)
Chloride: 106 mmol/L (ref 98–111)
Creatinine: 0.83 mg/dL (ref 0.44–1.00)
GFR, Estimated: >60 mL/min (ref >60)
Glucose, Bld: 121 mg/dL — ABNORMAL HIGH (ref 70–99)
Potassium: 3.9 mmol/L (ref 3.5–5.1)
Sodium: 141 mmol/L (ref 135–145)
Total Bilirubin: 0.5 mg/dL (ref 0.0–1.2)
Total Protein: 6.8 g/dL (ref 6.5–8.1)

## 2024-01-23 LAB — CBC WITH DIFFERENTIAL (CANCER CENTER ONLY)
Abs Immature Granulocytes: 0.01 K/uL (ref 0.00–0.07)
Basophils Absolute: 0 K/uL (ref 0.0–0.1)
Basophils Relative: 1 %
Eosinophils Absolute: 0.2 K/uL (ref 0.0–0.5)
Eosinophils Relative: 3 %
HCT: 43.1 % (ref 36.0–46.0)
Hemoglobin: 14 g/dL (ref 12.0–15.0)
Immature Granulocytes: 0 %
Lymphocytes Relative: 28 %
Lymphs Abs: 1.3 K/uL (ref 0.7–4.0)
MCH: 28.8 pg (ref 26.0–34.0)
MCHC: 32.5 g/dL (ref 30.0–36.0)
MCV: 88.7 fL (ref 80.0–100.0)
Monocytes Absolute: 0.3 K/uL (ref 0.1–1.0)
Monocytes Relative: 7 %
Neutro Abs: 2.9 K/uL (ref 1.7–7.7)
Neutrophils Relative %: 61 %
Platelet Count: 202 K/uL (ref 150–400)
RBC: 4.86 MIL/uL (ref 3.87–5.11)
RDW: 13.9 % (ref 11.5–15.5)
WBC Count: 4.7 K/uL (ref 4.0–10.5)
nRBC: 0 % (ref 0.0–0.2)

## 2024-01-23 MED ORDER — PROCHLORPERAZINE MALEATE 10 MG PO TABS: 10.0000 mg | ORAL_TABLET | Freq: Four times a day (QID) | ORAL | 1 refills | Status: DC | PRN

## 2024-01-23 MED ORDER — DEXAMETHASONE 4 MG PO TABS: ORAL_TABLET | ORAL | 1 refills | Status: DC

## 2024-01-23 MED ORDER — ONDANSETRON HCL 8 MG PO TABS: 8.0000 mg | ORAL_TABLET | Freq: Three times a day (TID) | ORAL | 1 refills | Status: DC | PRN

## 2024-01-23 NOTE — Progress Notes (Signed)
 [facility] CONSULT NOTE  Patient Care Team: James, Natalie W, PA-C as PCP - General (Family Medicine) Lonn Hicks, MD as Consulting Physician (Hematology and Oncology) Jannis Kate Norris, MD as Consulting Physician (Obstetrics and Gynecology) Tyree Nanetta SAILOR, RN as Oncology Nurse Navigator Gerome, Devere HERO, RN as Oncology Nurse Navigator Loretha Ash, MD as Consulting Physician (Hematology and Oncology) Shannon Agent, MD as Consulting Physician (Radiation Oncology) Vernetta Berg, MD as Consulting Physician (General Surgery)  CHIEF COMPLAINTS/PURPOSE OF CONSULTATION:  Newly diagnosed breast cancer  HISTORY OF PRESENTING ILLNESS:  Bonnie Collins 73 y.o. female is here because of recent diagnosis of left ILC.  I reviewed her records extensively and collaborated the history with the patient.  SUMMARY OF ONCOLOGIC HISTORY: Oncology History  Malignant neoplasm of overlapping sites of left breast in female, estrogen receptor positive (HCC)  11/13/2023 Mammogram   Mammogram with asymmetric increase in fibroglandular tissue in left breast which is indeterminate.  No sonographic correlate or abnormality to account for the asymmetric increase in the glandular tissue in the left breast.  Given this can be a subtle presentation of invasive lobular carcinoma contrast-enhanced mammogram is recommended  Contrast-enhanced imaging was successful and without incident this showed scattered non-mass enhancement throughout the left breast, anterior to middle depth.   11/21/2023 Pathology Results   Left breast needle core biopsy at 12:00 4 cm from the nipple showed overall grade 2 invasive mammary carcinoma. Left breast needle core biopsy retroareolar at 4:00 showed overall grade 2 invasive mammary carcinoma prognostic showed ER 95% positive strong staining intensity PR 50% positive staining intensity HER2 0 and Ki-67 of 20%   11/27/2023 Initial Diagnosis   Malignant neoplasm of  overlapping sites of left breast in female, estrogen receptor positive (HCC)    Genetic Testing   [facility] Cancer[facility] Panel+RNA was Negative. Report date is 12/09/2023.  The [facility] CancerNext+RNAinsight Panel includes sequencing, rearrangement analysis, and RNA analysis for the following 40 genes: APC, ATM, BAP1, BARD1, BMPR1A, BRCA1, BRCA2, BRIP1, CDH1, CDKN2A, CHEK2, FH, FLCN, MET, MLH1, MSH2, MSH6, MUTYH, NF1, NTHL1, PALB2, PMS2, PTEN, RAD51C, RAD51D, RPS20, SMAD4, STK11, TP53, TSC1, TSC2, and VHL (sequencing and deletion/duplication); AXIN2, HOXB13, MBD4, MSH3, POLD1 and POLE (sequencing only); EPCAM and GREM1 (deletion/duplication only).    11/28/2023 Cancer Staging   Staging form: Breast, AJCC 8th Edition - Pathologic: Stage IB (pT3, pN2a(sn), cM0, G2, ER+, PR+, HER2-) - Signed by Loretha Ash, MD on 12/24/2023 Method of lymph node assessment: Sentinel lymph node biopsy Multigene prognostic tests performed: None Histologic grading system: 3 grade system   01/25/2024 -  Chemotherapy   Patient is on Treatment Plan : BREAST TC q21d      Discussed the use of AI scribe software for clinical note transcription with the patient, who gave verbal consent to proceed.  History of Present Illness  Discussed the use of AI scribe software for clinical note transcription with the patient, who gave verbal consent to proceed.  History of Present illness   Bonnie Collins is a 73 year old female here before first planned cycle of chemotherapy who presents for a follow-up visit.  She is scheduled for her first session on September 19th, with a follow-up shot on September 22nd to prevent a significant drop in white blood cell count.  She is scheduled to attend a chemotherapy class to prepare for her treatment and has been prescribed two nausea medications and steroid pills, which have been sent to her [facility] pharmacy. She does not  have a port, so she does not require Emla cream.  She is  aware of the potential side effect of tingling and numbness in her hands and feet due to docetaxel  and plans to use ice packs during treatment to mitigate this.  She is planning to attend a family wedding on October 18th and has discussed adjusting her treatment schedule to accommodate travel plans.  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; s/p lumpectomy; s/p adj XRT; on adj Tamoxifen    Hypertension    Sinus problem     SURGICAL HISTORY: Past Surgical History:  Procedure Laterality Date   BREAST LUMPECTOMY Left 05/09/2007   CARPAL TUNNEL RELEASE Bilateral 1993 & 2003   MASTECTOMY W/ SENTINEL NODE BIOPSY Left 12/13/2023   Procedure: MASTECTOMY WITH SENTINEL LYMPH NODE BIOPSY;  Surgeon: Vernetta Berg, MD;  Location: [facility];  Service: General;  Laterality: Left;  LEFT BREAST MASTECTOMY AND SENTINEL NODE BIOPSY   TONSILLECTOMY  age 7     SOCIAL HISTORY: Social History   Socioeconomic History   Marital status: Single    Spouse name: Not on file   Number of children: 0   Years of education: Not on file   Highest education level: Not on file  Occupational History   Not on file  Tobacco Use   Smoking status: Never   Smokeless tobacco: Never  Vaping Use   Vaping status: Never Used  Substance and Sexual Activity   Alcohol use: Yes    Alcohol/week: 0.0 - 1.0 standard drinks of alcohol    Comment: occasionally   Drug use: No   Sexual activity: Not Currently    Partners: Male    Birth control/protection: Post-menopausal  Other Topics Concern   Not on file  Social History Narrative   Not on file   Social Drivers of Health   Financial Resource Strain: Not on file  Food Insecurity: No Food Insecurity (11/28/2023)   Hunger Vital Sign    Worried About Running Out of Food in the Last Year: Never true    Ran Out of Food in the Last Year:  Never true  Transportation Needs: No Transportation Needs (11/28/2023)   PRAPARE - Administrator, Civil Service (Medical): No    Lack of Transportation (Non-Medical): No  Physical Activity: Not on file  Stress: Not on file  Social Connections: Not on file  Intimate Partner Violence: Not At Risk (11/28/2023)   Humiliation, Afraid, Rape, and Kick questionnaire    Fear of Current or Ex-Partner: No    Emotionally Abused: No    Physically Abused: No    Sexually Abused: No    FAMILY HISTORY: Family History  Problem Relation Age of Onset   Diabetes Mother    Heart disease Mother    Heart disease Father    Heart failure Father    Prostate cancer Brother 76   Lung cancer Paternal Aunt 70       smoked   Multiple myeloma Paternal Uncle    Prostate cancer Maternal Cousin    Colon cancer Neg Hx     ALLERGIES:  has no known allergies.  MEDICATIONS:  Current Outpatient Medications  Medication Sig Dispense Refill   cholecalciferol (VITAMIN D3) 25 MCG (1000 UNIT) tablet Take 1,000 Units by mouth.     losartan  (COZAAR ) 50 MG tablet Take 1  tablet by mouth daily.     Multiple Vitamin (MULTIVITAMIN PO) Take 1 capsule by mouth.      rosuvastatin (CRESTOR) 10 MG tablet Take 10 mg by mouth.     dexamethasone  (DECADRON ) 4 MG tablet Take 2 tabs by mouth 2 times daily starting day before chemo. Then take 2 tabs daily for 2 days starting day after chemo. Take with food. 30 tablet 1   ondansetron  (ZOFRAN ) 8 MG tablet Take 1 tablet (8 mg total) by mouth every 8 (eight) hours as needed for nausea or vomiting. Start on the third day after chemotherapy. 30 tablet 1   prochlorperazine  (COMPAZINE ) 10 MG tablet Take 1 tablet (10 mg total) by mouth every 6 (six) hours as needed for nausea or vomiting. 30 tablet 1   traMADol  (ULTRAM ) 50 MG tablet Take 1 tablet (50 mg total) by mouth every 6 (six) hours as needed for moderate pain (pain score 4-6) or severe pain (pain score 7-10). 20 tablet 0   No  current facility-administered medications for this visit.   PHYSICAL EXAMINATION: ECOG PERFORMANCE STATUS: 0 - Asymptomatic  Vitals:   01/23/24 1259  BP: (!) 155/83  Pulse: 77  Resp: 20  Temp: 98.2 F (36.8 C)  SpO2: 94%     Filed Weights   01/23/24 1259  Weight: 247 lb 12.8 oz (112.4 kg)     She appears well, no acute distress  LABORATORY DATA:  I have reviewed the data as listed Lab Results  Component Value Date   WBC 4.7 01/23/2024   HGB 14.0 01/23/2024   HCT 43.1 01/23/2024   MCV 88.7 01/23/2024   PLT 202 01/23/2024   Lab Results  Component Value Date   NA 141 01/23/2024   K 3.9 01/23/2024   CL 106 01/23/2024   CO2 31 01/23/2024    RADIOGRAPHIC STUDIES: I have personally reviewed the radiological reports and agreed with the findings in the report.  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 Primary tumor 6.2 cm, negative margins. All eight axillary nodes positive with extracapsular extension. Staging, pending systemic scans. High recurrence risk given large tumor and 8/8 pos LN.    Breast cancer, currently receiving chemotherapy Currently undergoing chemotherapy with scheduled sessions on September 19th and October 14th. Adjustments made for travel plans. - Administer chemotherapy on September 19th and October 14th. - Adjust chemotherapy schedule to October 13th to accommodate travel plans. - Administer supportive care medications as needed.  Chemotherapy-induced neutropenia, prophylaxis and monitoring Risk of neutropenia due to chemotherapy, increasing infection risk. Prophylactic measures reduce infection risk from 20% to 5%. - Administer prophylactic GCSF on September 22nd and October 14th to reduce infection risk. - Monitor for signs of infection such as fever, chills, burning  urination, persistent cough, or shortness of breath. - Instruct to call if any signs of infection occur.  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 Cold therapy recommended. - Instruct to bring Ziploc bags for ice therapy during treatment. - Evaluate eligibility for clinical trial offering a more comfortable cooling method.  Bone pain is a common side effect of the prophylactic shot. Claritin recommended, with Tylenol  for severe pain, considering fever masking risk. - Recommend Claritin for bone pain relief. - Allow use of extra strong Tylenol  for severe bone pain, with caution regarding masking fever.  All questions were answered. The patient knows to call the clinic with any problems, questions or concerns.    Amber Stalls, MD 01/23/24

## 2024-01-23 NOTE — Progress Notes (Signed)
 [facility]       Telephone: [phone]?Fax: [phone]   Oncology Clinical Pharmacist Practitioner Initial Assessment  Bonnie Collins is a 73 y.o. female with a diagnosis of breast cancer. They were contacted today via in-person visit.  Indication/Regimen Docetaxel  (Taxotere ) and cyclophosphamide  (Cytoxan ) are being used appropriately for treatment of breast cancer by Dr. Amber Stalls.      Wt Readings from Last 1 Encounters:  01/23/24 247 lb 12.8 oz (112.4 kg)    Estimated body surface area is 2.29 meters squared as calculated from the following:   Height as of 12/13/23: 5' 6 (1.676 m).   Weight as of an earlier encounter on 01/23/24: 247 lb 12.8 oz (112.4 kg).  The dosing regimen cycle is every 21 days x 4 cycles  Docetaxel  (75 mg/m2) on Day 1 Cyclophosphamide  (600 mg/m2) on Day 1 Pegfilgrastim (6 mg) on Day 3  It is planned to continue until treatment plan completion or unacceptable toxicity. The tentative start date is: 01/25/24  Dose Modifications None   Allergies No Known Allergies  Vitals    01/23/2024   12:59 PM 01/08/2024    9:41 AM 01/08/2024    8:55 AM  Oncology Vitals  Weight 112.401 kg  111.358 kg  Weight (lbs) 247 lbs 13 oz  245 lbs 8 oz  BMI 40 kg/m2  39.62 kg/m2  Temp 98.2 F (36.8 C)  98.2 F (36.8 C)  Pulse Rate 77  93  BP 155/83 150/86 163/80  Resp 20  20  SpO2 94 %  97 %  BSA (m2) 2.29 m2  2.28 m2     Laboratory Data    Latest Ref Rng & Units 01/23/2024   12:41 PM 11/28/2023    1:15 PM 01/12/2015   10:57 AM  CBC EXTENDED  WBC 4.0 - 10.5 K/uL 4.7  5.2    RBC 3.87 - 5.11 MIL/uL 4.86  4.84    Hemoglobin 12.0 - 15.0 g/dL 85.9  86.1  85.2   HCT 36.0 - 46.0 % 43.1  43.1    Platelets 150 - 400 K/uL 202  193    NEUT# 1.7 - 7.7 K/uL 2.9  3.2    Lymph# 0.7 - 4.0 K/uL 1.3  1.4         Latest Ref Rng & Units 01/23/2024   12:41 PM 11/28/2023    1:15 PM 11/20/2013   12:51 PM  CMP  Glucose 70 - 99 mg/dL 878  897  858    BUN 8 - 23 mg/dL 13  11  86.6   Creatinine 0.44 - 1.00 mg/dL 9.16  9.13  0.9   Sodium 135 - 145 mmol/L 141  141  144   Potassium 3.5 - 5.1 mmol/L 3.9  4.8  4.0   Chloride 98 - 111 mmol/L 106  106    CO2 22 - 32 mmol/L 31  31  28    Calcium 8.9 - 10.3 mg/dL 9.1  9.4  9.6   Total Protein 6.5 - 8.1 g/dL 6.8  7.2  6.8   Total Bilirubin 0.0 - 1.2 mg/dL 0.5  0.5  9.64   Alkaline Phos 38 - 126 U/L 67  73  72   AST 15 - 41 U/L 13  13  16    ALT 0 - 44 U/L 10  10  13     Contraindications Contraindications were reviewed? Yes Contraindications to therapy were identified? No   Safety Precautions The following safety precautions were  reviewed:  Fever: reviewed the importance of having a thermometer and the Centers for Disease Control and Prevention (CDC) definition of fever which is 100.7F (38C) or higher. Patient should call 24/7 triage at [phone] if experiencing a fever or any other symptoms Decreased white blood cells (WBCs) and increased risk for infection Decreased platelet count and increased risk of bleeding Decreased hemoglobin, part of the red blood cells that carry iron and oxygen Hair Loss Fatigue Fluid retention or swelling (edema) Peripheral Neuropathy Mouth sores Rash or itchy skin Muscle or joint pain or weakness Nausea or vomiting Diarrhea Nail Changes Hypersensitivity reactions Secondary Malignancies Hemorrhagic cystitis Pneumonitis Handling body fluids and waste Intimacy, sexual activity, contraception, fertility  Medication Reconciliation Current Outpatient Medications  Medication Sig Dispense Refill   cholecalciferol (VITAMIN D3) 25 MCG (1000 UNIT) tablet Take 1,000 Units by mouth.     dexamethasone  (DECADRON ) 4 MG tablet Take 2 tabs by mouth 2 times daily starting day before chemo. Then take 2 tabs daily for 2 days starting day after chemo. Take with food. 30 tablet 1   losartan  (COZAAR ) 50 MG tablet Take 1 tablet by mouth daily.     Multiple Vitamin  (MULTIVITAMIN PO) Take 1 capsule by mouth.      ondansetron  (ZOFRAN ) 8 MG tablet Take 1 tablet (8 mg total) by mouth every 8 (eight) hours as needed for nausea or vomiting. Start on the third day after chemotherapy. 30 tablet 1   prochlorperazine  (COMPAZINE ) 10 MG tablet Take 1 tablet (10 mg total) by mouth every 6 (six) hours as needed for nausea or vomiting. 30 tablet 1   rosuvastatin (CRESTOR) 10 MG tablet Take 10 mg by mouth.     traMADol  (ULTRAM ) 50 MG tablet Take 1 tablet (50 mg total) by mouth every 6 (six) hours as needed for moderate pain (pain score 4-6) or severe pain (pain score 7-10). 20 tablet 0   No current facility-administered medications for this visit.    Medication reconciliation is based on the patient's most recent medication list in the electronic medical record (EMR) including herbal products and OTC medications.   The patient's medication list was reviewed today with the patient? Yes   Drug-drug interactions (DDIs) DDIs were evaluated? Yes Significant DDIs identified? No   Drug-Food Interactions Drug-food interactions were evaluated? Yes Drug-food interactions identified? No   Follow-up Plan  Treatment start date: 01/25/24 [facility] placement date: Peripheral We reviewed the prescriptions, premedications, and treatment regimen with the patient. Possible side effects of the treatment regimen were reviewed and management strategies were discussed.  Can use loperamide as needed for diarrhea, loratadine as needed for G-CSF bone pain, and Senna-S as needed for constipation.  Clinical pharmacy will assist Dr. Praveena Iruku and Makinzey E Rehmann on an as needed basis going forward  SMT LOKEY participated in the discussion, expressed understanding, and voiced agreement with the above plan. All questions were answered to her satisfaction. The patient was advised to contact the clinic at [phone] with any questions or concerns prior to her return visit.   I spent  60 minutes assessing the patient.  Charita Lindenberger A. Lucila, PharmD, BCOP, CPP  Norleen DELENA Lucila, RPH-CPP, 01/23/2024 1:35 PM  **Disclaimer: This note was dictated with voice recognition software. Similar sounding words can inadvertently be transcribed and this note may contain transcription errors which may not have been corrected upon publication of note.**

## 2024-01-25 ENCOUNTER — Telehealth: Payer: Self-pay

## 2024-01-25 ENCOUNTER — Inpatient Hospital Stay

## 2024-01-25 VITALS — BP 125/67 | HR 67 | Temp 98.7°F | Resp 17

## 2024-01-25 DIAGNOSIS — C50812 Malignant neoplasm of overlapping sites of left female breast: Secondary | ICD-10-CM | POA: Diagnosis not present

## 2024-01-25 DIAGNOSIS — Z170 Estrogen receptor positive status [ER+]: Secondary | ICD-10-CM

## 2024-01-25 MED ORDER — DEXAMETHASONE SODIUM PHOSPHATE 10 MG/ML IJ SOLN
10.0000 mg | Freq: Once | INTRAMUSCULAR | Status: AC
  Administered 2024-01-25: 10 mg via INTRAVENOUS
  Filled 2024-01-25: qty 1

## 2024-01-25 MED ORDER — SODIUM CHLORIDE 0.9 % IV SOLN
75.0000 mg/m2 | Freq: Once | INTRAVENOUS | Status: AC
  Administered 2024-01-25: 160 mg via INTRAVENOUS
  Filled 2024-01-25: qty 16

## 2024-01-25 MED ORDER — SODIUM CHLORIDE 0.9 % IV SOLN
600.0000 mg/m2 | Freq: Once | INTRAVENOUS | Status: AC
  Administered 2024-01-25: 1500 mg via INTRAVENOUS
  Filled 2024-01-25: qty 75

## 2024-01-25 MED ORDER — PALONOSETRON HCL INJECTION 0.25 MG/5ML
0.2500 mg | Freq: Once | INTRAVENOUS | Status: AC
  Administered 2024-01-25: 0.25 mg via INTRAVENOUS
  Filled 2024-01-25: qty 5

## 2024-01-25 MED ORDER — SODIUM CHLORIDE 0.9 % IV SOLN: INTRAVENOUS | Status: DC

## 2024-01-25 NOTE — Telephone Encounter (Signed)
 Pt called to request wig rx to be send to [facility]. Per MD, order written and faxed. Confirmation received. Pt is aware and verbalized thanks.

## 2024-01-25 NOTE — Patient Instructions (Signed)
 [facility] - A DEPT OF [facility]  Discharge Instructions: Thank you for choosing [facility] to provide your oncology and hematology care.   If you have a lab appointment with the Cancer Center, please go directly to the Cancer Center and check in at the registration area.   Wear comfortable clothing and clothing appropriate for easy access to any Portacath or PICC line.   We strive to give you quality time with your provider. You may need to reschedule your appointment if you arrive late (15 or more minutes).  Arriving late affects you and other patients whose appointments are after yours.  Also, if you miss three or more appointments without notifying the office, you may be dismissed from the clinic at the provider's discretion.      For prescription refill requests, have your pharmacy contact our office and allow 72 hours for refills to be completed.    Today you received the following chemotherapy and/or immunotherapy agents: docetaxel and cyclophosphamide      To help prevent nausea and vomiting after your treatment, we encourage you to take your nausea medication as directed.  BELOW ARE SYMPTOMS THAT SHOULD BE REPORTED IMMEDIATELY: *FEVER GREATER THAN 100.4 F (38 C) OR HIGHER *CHILLS OR SWEATING *NAUSEA AND VOMITING THAT IS NOT CONTROLLED WITH YOUR NAUSEA MEDICATION *UNUSUAL SHORTNESS OF BREATH *UNUSUAL BRUISING OR BLEEDING *URINARY PROBLEMS (pain or burning when urinating, or frequent urination) *BOWEL PROBLEMS (unusual diarrhea, constipation, pain near the anus) TENDERNESS IN MOUTH AND THROAT WITH OR WITHOUT PRESENCE OF ULCERS (sore throat, sores in mouth, or a toothache) UNUSUAL RASH, SWELLING OR PAIN  UNUSUAL VAGINAL DISCHARGE OR ITCHING   Items with * indicate a potential emergency and should be followed up as soon as possible or go to the Emergency Department if any problems should occur.  Please show the CHEMOTHERAPY ALERT  CARD or IMMUNOTHERAPY ALERT CARD at check-in to the Emergency Department and triage nurse.  Should you have questions after your visit or need to cancel or reschedule your appointment, please contact [facility] - A DEPT OF [facility]  Dept: [phone]  and follow the prompts.  Office hours are 8:00 a.m. to 4:30 p.m. Monday - Friday. Please note that voicemails left after 4:00 p.m. may not be returned until the following business day.  We are closed weekends and major holidays. You have access to a nurse at all times for urgent questions. Please call the main number to the clinic Dept: [phone] and follow the prompts.   For any non-urgent questions, you may also contact your provider using MyChart. We now offer e-Visits for anyone 25 and older to request care online for non-urgent symptoms. For details visit [email or web address].   Also download the MyChart app! Go to the app store, search "MyChart", open the app, select [facility], and log in with your MyChart username and password.

## 2024-01-28 ENCOUNTER — Telehealth: Payer: Self-pay

## 2024-01-28 ENCOUNTER — Inpatient Hospital Stay

## 2024-01-28 VITALS — BP 151/78 | HR 79 | Temp 98.4°F | Resp 18

## 2024-01-28 DIAGNOSIS — C50812 Malignant neoplasm of overlapping sites of left female breast: Secondary | ICD-10-CM | POA: Diagnosis not present

## 2024-01-28 MED ORDER — PEGFILGRASTIM-JMDB 6 MG/0.6ML ~~LOC~~ SOSY
6.0000 mg | PREFILLED_SYRINGE | Freq: Once | SUBCUTANEOUS | Status: AC
  Administered 2024-01-28: 6 mg via SUBCUTANEOUS
  Filled 2024-01-28: qty 0.6

## 2024-01-28 NOTE — Telephone Encounter (Signed)
-----   Message from Nurse Bernardino DEL sent at 01/25/2024 11:10 AM EDT ----- Regarding: 1st Time Taxotere /Cytoxan  Dr Loretha Follow up 1st Time Taxotere /Cytoxan . Dr Walter patient. Also received GCSF injection on Monday. No issues with treatment.

## 2024-01-28 NOTE — Patient Instructions (Signed)

## 2024-01-31 ENCOUNTER — Encounter: Payer: Self-pay | Admitting: *Deleted

## 2024-02-01 ENCOUNTER — Inpatient Hospital Stay: Admitting: Hematology and Oncology

## 2024-02-01 VITALS — BP 135/69 | HR 96 | Temp 98.0°F | Resp 19 | Wt 243.5 lb

## 2024-02-01 DIAGNOSIS — C50812 Malignant neoplasm of overlapping sites of left female breast: Secondary | ICD-10-CM | POA: Diagnosis not present

## 2024-02-01 DIAGNOSIS — Z170 Estrogen receptor positive status [ER+]: Secondary | ICD-10-CM

## 2024-02-01 NOTE — Progress Notes (Signed)
 [facility] CONSULT NOTE  Patient Care Team: James, Natalie W, PA-C as PCP - General (Family Medicine) Lonn Hicks, MD as Consulting Physician (Hematology and Oncology) Jannis Kate Norris, MD as Consulting Physician (Obstetrics and Gynecology) Tyree Nanetta SAILOR, RN as Oncology Nurse Navigator Gerome, Devere HERO, RN as Oncology Nurse Navigator Loretha Ash, MD as Consulting Physician (Hematology and Oncology) Shannon Agent, MD as Consulting Physician (Radiation Oncology) Vernetta Berg, MD as Consulting Physician (General Surgery)  CHIEF COMPLAINTS/PURPOSE OF CONSULTATION:  Newly diagnosed breast cancer  HISTORY OF PRESENTING ILLNESS:  Bonnie Collins 73 y.o. female is here because of recent diagnosis of left ILC.  I reviewed her records extensively and collaborated the history with the patient.  SUMMARY OF ONCOLOGIC HISTORY: Oncology History  Malignant neoplasm of overlapping sites of left breast in female, estrogen receptor positive (HCC)  11/13/2023 Mammogram   Mammogram with asymmetric increase in fibroglandular tissue in left breast which is indeterminate.  No sonographic correlate or abnormality to account for the asymmetric increase in the glandular tissue in the left breast.  Given this can be a subtle presentation of invasive lobular carcinoma contrast-enhanced mammogram is recommended  Contrast-enhanced imaging was successful and without incident this showed scattered non-mass enhancement throughout the left breast, anterior to middle depth.   11/21/2023 Pathology Results   Left breast needle core biopsy at 12:00 4 cm from the nipple showed overall grade 2 invasive mammary carcinoma. Left breast needle core biopsy retroareolar at 4:00 showed overall grade 2 invasive mammary carcinoma prognostic showed ER 95% positive strong staining intensity PR 50% positive staining intensity HER2 0 and Ki-67 of 20%   11/27/2023 Initial Diagnosis   Malignant neoplasm of  overlapping sites of left breast in female, estrogen receptor positive (HCC)    Genetic Testing   [facility] CancerNext Panel+RNA was Negative. Report date is 12/09/2023.  The [facility] CancerNext+RNAinsight Panel includes sequencing, rearrangement analysis, and RNA analysis for the following 40 genes: APC, ATM, BAP1, BARD1, BMPR1A, BRCA1, BRCA2, BRIP1, CDH1, CDKN2A, CHEK2, FH, FLCN, MET, MLH1, MSH2, MSH6, MUTYH, NF1, NTHL1, PALB2, PMS2, PTEN, RAD51C, RAD51D, RPS20, SMAD4, STK11, TP53, TSC1, TSC2, and VHL (sequencing and deletion/duplication); AXIN2, HOXB13, MBD4, MSH3, POLD1 and POLE (sequencing only); EPCAM and GREM1 (deletion/duplication only).    11/28/2023 Cancer Staging   Staging form: Breast, AJCC 8th Edition - Pathologic: Stage IB (pT3, pN2a(sn), cM0, G2, ER+, PR+, HER2-) - Signed by Loretha Ash, MD on 12/24/2023 Method of lymph node assessment: Sentinel lymph node biopsy Multigene prognostic tests performed: None Histologic grading system: 3 grade system   01/25/2024 -  Chemotherapy   Patient is on Treatment Plan : BREAST TC q21d      Discussed the use of AI scribe software for clinical note transcription with the patient, who gave verbal consent to proceed.  History of Present Illness  Bonnie Collins is a 73 year old female undergoing chemotherapy for cancer who presents for follow-up after her first chemotherapy session with TC.  She recently completed her first chemotherapy session, experiencing mild nausea on the first night post-treatment, which was managed with medication. The medication led to constipation, which she addressed by taking Senna.  She reports bone pain, particularly in her knees and legs, which initially did not interfere with her sleep. Starting Wednesday, she developed new throbbing bone pain in her lower back, which she attributes to a recent injection. She has been managing the pain with Claritin in the morning and Tylenol , taken six hours apart last night,  providing some relief. As of today, the pain has improved.  She notes a site on her arm where the IV was administered, which may have leaked some medication.  She feels a bit tired and not 'a hundred percent perky,' but she is eating well, focusing on smaller meals and ensuring adequate fiber intake. No mouth sores are present, and she uses betaine to manage a 'cottony or dry' mouth feeling.  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; s/p lumpectomy; s/p adj XRT; on adj Tamoxifen    Hypertension    Sinus problem     SURGICAL HISTORY: Past Surgical History:  Procedure Laterality Date   BREAST LUMPECTOMY Left 05/09/2007   CARPAL TUNNEL RELEASE Bilateral 1993 & 2003   MASTECTOMY W/ SENTINEL NODE BIOPSY Left 12/13/2023   Procedure: MASTECTOMY WITH SENTINEL LYMPH NODE BIOPSY;  Surgeon: Vernetta Berg, MD;  Location: [facility];  Service: General;  Laterality: Left;  LEFT BREAST MASTECTOMY AND SENTINEL NODE BIOPSY   TONSILLECTOMY  age 18     SOCIAL HISTORY: Social History   Socioeconomic History   Marital status: Single    Spouse name: Not on file   Number of children: 0   Years of education: Not on file   Highest education level: Not on file  Occupational History   Not on file  Tobacco Use   Smoking status: Never   Smokeless tobacco: Never  Vaping Use   Vaping status: Never Used  Substance and Sexual Activity   Alcohol use: Yes    Alcohol/week: 0.0 - 1.0 standard drinks of alcohol    Comment: occasionally   Drug use: No   Sexual activity: Not Currently    Partners: Male    Birth control/protection: Post-menopausal  Other Topics Concern   Not on file  Social History Narrative   Not on file   Social Drivers of Health   Financial Resource Strain: Not on file  Food Insecurity: Low Risk  (01/31/2024)   Received from [facility]   Hunger  Vital Sign    Within the past 12 months, you worried that your food would run out before you got money to buy more: Never true    Within the past 12 months, the food you bought just didn't last and you didn't have money to get more. : Never true  Transportation Needs: No Transportation Needs (01/31/2024)   Received from [facility]    In the past 12 months, has lack of reliable transportation kept you from medical appointments, meetings, work or from getting things needed for daily living? : No  Physical Activity: Not on file  Stress: Not on file  Social Connections: Not on file  Intimate Partner Violence: Not At Risk (11/28/2023)   Humiliation, Afraid, Rape, and Kick questionnaire    Fear of Current or Ex-Partner: No    Emotionally Abused: No    Physically Abused: No    Sexually Abused: No    FAMILY HISTORY: Family History  Problem Relation Age of Onset   Diabetes Mother    Heart disease Mother    Heart disease Father    Heart failure Father    Prostate cancer Brother 60   Lung cancer Paternal Aunt 23       smoked   Multiple myeloma Paternal Uncle    Prostate cancer Maternal Cousin  Colon cancer Neg Hx     ALLERGIES:  has no known allergies.  MEDICATIONS:  Current Outpatient Medications  Medication Sig Dispense Refill   cholecalciferol (VITAMIN D3) 25 MCG (1000 UNIT) tablet Take 1,000 Units by mouth.     dexamethasone  (DECADRON ) 4 MG tablet Take 2 tabs by mouth 2 times daily starting day before chemo. Then take 2 tabs daily for 2 days starting day after chemo. Take with food. (Patient not taking: Reported on 01/23/2024) 30 tablet 1   losartan  (COZAAR ) 50 MG tablet Take 1 tablet by mouth daily.     Multiple Vitamin (MULTIVITAMIN PO) Take 1 capsule by mouth.      ondansetron  (ZOFRAN ) 8 MG tablet Take 1 tablet (8 mg total) by mouth every 8 (eight) hours as needed for nausea or vomiting. Start on the third day after chemotherapy. (Patient not taking: Reported  on 01/23/2024) 30 tablet 1   prochlorperazine  (COMPAZINE ) 10 MG tablet Take 1 tablet (10 mg total) by mouth every 6 (six) hours as needed for nausea or vomiting. (Patient not taking: Reported on 01/23/2024) 30 tablet 1   rosuvastatin (CRESTOR) 10 MG tablet Take 10 mg by mouth.     No current facility-administered medications for this visit.   PHYSICAL EXAMINATION: ECOG PERFORMANCE STATUS: 0 - Asymptomatic  Vitals:   02/01/24 1355  BP: 135/69  Pulse: 96  Resp: 19  Temp: 98 F (36.7 C)  SpO2: 90%     Filed Weights   02/01/24 1355  Weight: 243 lb 8 oz (110.5 kg)     She appears well, no acute distress CTA bilaterally Rt infusion site with some extravasation. RRR Abdomen: soft, non tender, non distended, no organomegaly No LE edema.  LABORATORY DATA:  I have reviewed the data as listed Lab Results  Component Value Date   WBC 4.7 01/23/2024   HGB 14.0 01/23/2024   HCT 43.1 01/23/2024   MCV 88.7 01/23/2024   PLT 202 01/23/2024   Lab Results  Component Value Date   NA 141 01/23/2024   K 3.9 01/23/2024   CL 106 01/23/2024   CO2 31 01/23/2024    RADIOGRAPHIC STUDIES: I have personally reviewed the radiological reports and agreed with the findings in the report.  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 Primary tumor 6.2 cm, negative margins. All eight axillary nodes positive with extracapsular extension. Staging, pending systemic scans. High recurrence risk given large tumor and 8/8 pos LN.   Breast cancer, status post mastectomy, on chemotherapy Undergoing chemotherapy with first cycle well-tolerated.  - Continue current chemotherapy regimen. - Plan for radiation therapy post-chemotherapy. - Initiate oral medication with AI and CDK 4/6 inhibition post-chemotherapy and radiation to prevent  recurrence. - Coordinate with IV team for line placement for future chemotherapy sessions.  Chemotherapy-induced nausea and constipation Mild nausea managed with anti-nausea medication.  Ok to take nausea medication PRN  Constipation due to anti-nausea medication. - Advise taking Senna concurrently with anti-nausea medication to prevent constipation.  Chemotherapy-induced bone pain Bone pain in knees, legs, and lower back managed with Claritin and Tylenol . Symptoms expected to improve by Monday. - Continue Claritin in the morning. - Use Tylenol  as needed for pain management.  Fatigue related to chemotherapy Experiencing fatigue, common with chemotherapy. Reports feeling 90% of usual energy level. Continue to monitor.  All questions were answered. The patient  knows to call the clinic with any problems, questions or concerns.    Amber Stalls, MD 02/01/24

## 2024-02-05 ENCOUNTER — Telehealth: Payer: Self-pay

## 2024-02-05 NOTE — Telephone Encounter (Signed)
 Pt called and LVM stating she has nasal congestion, but only one side of her nose and aks for advice since she is on chemo. Denies having a fever on VM.   Attempted to call pt. LVM that I will send her a mychart message.

## 2024-02-18 ENCOUNTER — Inpatient Hospital Stay: Attending: Internal Medicine

## 2024-02-18 ENCOUNTER — Inpatient Hospital Stay: Admitting: Hematology and Oncology

## 2024-02-18 ENCOUNTER — Encounter: Payer: Self-pay | Admitting: Hematology and Oncology

## 2024-02-18 ENCOUNTER — Inpatient Hospital Stay

## 2024-02-18 VITALS — BP 124/72 | HR 76 | Temp 98.7°F | Resp 16 | Wt 244.2 lb

## 2024-02-18 DIAGNOSIS — K5900 Constipation, unspecified: Secondary | ICD-10-CM | POA: Diagnosis not present

## 2024-02-18 DIAGNOSIS — N6489 Other specified disorders of breast: Secondary | ICD-10-CM | POA: Insufficient documentation

## 2024-02-18 DIAGNOSIS — Z833 Family history of diabetes mellitus: Secondary | ICD-10-CM | POA: Diagnosis not present

## 2024-02-18 DIAGNOSIS — Z801 Family history of malignant neoplasm of trachea, bronchus and lung: Secondary | ICD-10-CM | POA: Diagnosis not present

## 2024-02-18 DIAGNOSIS — Z8042 Family history of malignant neoplasm of prostate: Secondary | ICD-10-CM | POA: Insufficient documentation

## 2024-02-18 DIAGNOSIS — Z79899 Other long term (current) drug therapy: Secondary | ICD-10-CM | POA: Diagnosis not present

## 2024-02-18 DIAGNOSIS — Z807 Family history of other malignant neoplasms of lymphoid, hematopoietic and related tissues: Secondary | ICD-10-CM | POA: Insufficient documentation

## 2024-02-18 DIAGNOSIS — Z17411 Hormone receptor positive with human epidermal growth factor receptor 2 negative status: Secondary | ICD-10-CM | POA: Insufficient documentation

## 2024-02-18 DIAGNOSIS — R110 Nausea: Secondary | ICD-10-CM | POA: Insufficient documentation

## 2024-02-18 DIAGNOSIS — C50812 Malignant neoplasm of overlapping sites of left female breast: Secondary | ICD-10-CM | POA: Diagnosis present

## 2024-02-18 DIAGNOSIS — T451X5A Adverse effect of antineoplastic and immunosuppressive drugs, initial encounter: Secondary | ICD-10-CM | POA: Insufficient documentation

## 2024-02-18 DIAGNOSIS — Z170 Estrogen receptor positive status [ER+]: Secondary | ICD-10-CM | POA: Diagnosis not present

## 2024-02-18 DIAGNOSIS — Z5111 Encounter for antineoplastic chemotherapy: Secondary | ICD-10-CM | POA: Diagnosis present

## 2024-02-18 DIAGNOSIS — Z8249 Family history of ischemic heart disease and other diseases of the circulatory system: Secondary | ICD-10-CM | POA: Insufficient documentation

## 2024-02-18 DIAGNOSIS — Z9012 Acquired absence of left breast and nipple: Secondary | ICD-10-CM | POA: Diagnosis not present

## 2024-02-18 LAB — CMP (CANCER CENTER ONLY)
ALT: 10 U/L (ref 0–44)
AST: 11 U/L — ABNORMAL LOW (ref 15–41)
Albumin: 4.1 g/dL (ref 3.5–5.0)
Alkaline Phosphatase: 64 U/L (ref 38–126)
Anion gap: 5 (ref 5–15)
BUN: 13 mg/dL (ref 8–23)
CO2: 27 mmol/L (ref 22–32)
Calcium: 9.7 mg/dL (ref 8.9–10.3)
Chloride: 107 mmol/L (ref 98–111)
Creatinine: 0.74 mg/dL (ref 0.44–1.00)
GFR, Estimated: >60 mL/min (ref >60)
Glucose, Bld: 107 mg/dL — ABNORMAL HIGH (ref 70–99)
Potassium: 3.9 mmol/L (ref 3.5–5.1)
Sodium: 139 mmol/L (ref 135–145)
Total Bilirubin: 0.5 mg/dL (ref 0.0–1.2)
Total Protein: 6.8 g/dL (ref 6.5–8.1)

## 2024-02-18 LAB — CBC WITH DIFFERENTIAL (CANCER CENTER ONLY)
Abs Immature Granulocytes: 0.02 K/uL (ref 0.00–0.07)
Basophils Absolute: 0 K/uL (ref 0.0–0.1)
Basophils Relative: 0 %
Eosinophils Absolute: 0 K/uL (ref 0.0–0.5)
Eosinophils Relative: 0 %
HCT: 37.6 % (ref 36.0–46.0)
Hemoglobin: 12.7 g/dL (ref 12.0–15.0)
Immature Granulocytes: 0 %
Lymphocytes Relative: 18 %
Lymphs Abs: 1.3 K/uL (ref 0.7–4.0)
MCH: 29.2 pg (ref 26.0–34.0)
MCHC: 33.8 g/dL (ref 30.0–36.0)
MCV: 86.4 fL (ref 80.0–100.0)
Monocytes Absolute: 0.7 K/uL (ref 0.1–1.0)
Monocytes Relative: 10 %
Neutro Abs: 5 K/uL (ref 1.7–7.7)
Neutrophils Relative %: 72 %
Platelet Count: 376 K/uL (ref 150–400)
RBC: 4.35 MIL/uL (ref 3.87–5.11)
RDW: 14.7 % (ref 11.5–15.5)
WBC Count: 7.1 K/uL (ref 4.0–10.5)
nRBC: 0 % (ref 0.0–0.2)

## 2024-02-18 MED ORDER — SODIUM CHLORIDE 0.9 % IV SOLN
75.0000 mg/m2 | Freq: Once | INTRAVENOUS | Status: AC
  Administered 2024-02-18: 160 mg via INTRAVENOUS
  Filled 2024-02-18: qty 16

## 2024-02-18 MED ORDER — SODIUM CHLORIDE 0.9 % IV SOLN: INTRAVENOUS | Status: DC

## 2024-02-18 MED ORDER — SODIUM CHLORIDE 0.9 % IV SOLN
600.0000 mg/m2 | Freq: Once | INTRAVENOUS | Status: AC
  Administered 2024-02-18: 1500 mg via INTRAVENOUS
  Filled 2024-02-18: qty 75

## 2024-02-18 MED ORDER — PALONOSETRON HCL INJECTION 0.25 MG/5ML
0.2500 mg | Freq: Once | INTRAVENOUS | Status: AC
  Administered 2024-02-18: 0.25 mg via INTRAVENOUS
  Filled 2024-02-18: qty 5

## 2024-02-18 MED ORDER — DEXAMETHASONE SOD PHOSPHATE PF 10 MG/ML IJ SOLN
10.0000 mg | Freq: Once | INTRAMUSCULAR | Status: AC
  Administered 2024-02-18: 10 mg via INTRAVENOUS

## 2024-02-18 NOTE — Progress Notes (Signed)
 [facility] CONSULT NOTE  Patient Care Team: James, Natalie W, PA-C as PCP - General (Family Medicine) Lonn Hicks, MD as Consulting Physician (Hematology and Oncology) Jannis Kate Norris, MD as Consulting Physician (Obstetrics and Gynecology) Tyree Nanetta SAILOR, RN as Oncology Nurse Navigator Gerome, Devere HERO, RN as Oncology Nurse Navigator Loretha Ash, MD as Consulting Physician (Hematology and Oncology) Shannon Agent, MD as Consulting Physician (Radiation Oncology) Vernetta Berg, MD as Consulting Physician (General Surgery)  CHIEF COMPLAINTS/PURPOSE OF CONSULTATION:  Newly diagnosed breast cancer  HISTORY OF PRESENTING ILLNESS:  Bonnie Collins 73 y.o. female is here because of recent diagnosis of left ILC.  I reviewed her records extensively and collaborated the history with the patient.  SUMMARY OF ONCOLOGIC HISTORY: Oncology History  Malignant neoplasm of overlapping sites of left breast in female, estrogen receptor positive (HCC)  11/13/2023 Mammogram   Mammogram with asymmetric increase in fibroglandular tissue in left breast which is indeterminate.  No sonographic correlate or abnormality to account for the asymmetric increase in the glandular tissue in the left breast.  Given this can be a subtle presentation of invasive lobular carcinoma contrast-enhanced mammogram is recommended  Contrast-enhanced imaging was successful and without incident this showed scattered non-mass enhancement throughout the left breast, anterior to middle depth.   11/21/2023 Pathology Results   Left breast needle core biopsy at 12:00 4 cm from the nipple showed overall grade 2 invasive mammary carcinoma. Left breast needle core biopsy retroareolar at 4:00 showed overall grade 2 invasive mammary carcinoma prognostic showed ER 95% positive strong staining intensity PR 50% positive staining intensity HER2 0 and Ki-67 of 20%   11/27/2023 Initial Diagnosis   Malignant neoplasm of  overlapping sites of left breast in female, estrogen receptor positive (HCC)    Genetic Testing   Ambry CancerNext Panel+RNA was Negative. Report date is 12/09/2023.  The [facility] CancerNext+RNAinsight Panel includes sequencing, rearrangement analysis, and RNA analysis for the following 40 genes: APC, ATM, BAP1, BARD1, BMPR1A, BRCA1, BRCA2, BRIP1, CDH1, CDKN2A, CHEK2, FH, FLCN, MET, MLH1, MSH2, MSH6, MUTYH, NF1, NTHL1, PALB2, PMS2, PTEN, RAD51C, RAD51D, RPS20, SMAD4, STK11, TP53, TSC1, TSC2, and VHL (sequencing and deletion/duplication); AXIN2, HOXB13, MBD4, MSH3, POLD1 and POLE (sequencing only); EPCAM and GREM1 (deletion/duplication only).    11/28/2023 Cancer Staging   Staging form: Breast, AJCC 8th Edition - Pathologic: Stage IB (pT3, pN2a(sn), cM0, G2, ER+, PR+, HER2-) - Signed by Loretha Ash, MD on 12/24/2023 Method of lymph node assessment: Sentinel lymph node biopsy Multigene prognostic tests performed: None Histologic grading system: 3 grade system   01/25/2024 -  Chemotherapy   Patient is on Treatment Plan : BREAST TC q21d      Discussed the use of AI scribe software for clinical note transcription with the patient, who gave verbal consent to proceed.  History of Present Illness  Bonnie Collins is a 73 year old female undergoing chemotherapy who presents for follow-up regarding treatment side effects.  She has experienced hair loss, which she noticed while sitting outside and running her hand through her hair. She manages this change by using a wig and wearing hats.  During the first week after her last chemotherapy session, she felt 'kind of blah', but has felt normal over the past two weeks. She takes medication to manage potential nausea, which causes constipation. She uses Senna S to alleviate this side effect.  No mouth sores are present, and she confirms that she does not have a port, so she is being poked  for blood draws. No fever is reported.  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; s/p lumpectomy; s/p adj XRT; on adj Tamoxifen    Hypertension    Sinus problem     SURGICAL HISTORY: Past Surgical History:  Procedure Laterality Date   BREAST LUMPECTOMY Left 05/09/2007   CARPAL TUNNEL RELEASE Bilateral 1993 & 2003   MASTECTOMY W/ SENTINEL NODE BIOPSY Left 12/13/2023   Procedure: MASTECTOMY WITH SENTINEL LYMPH NODE BIOPSY;  Surgeon: Vernetta Berg, MD;  Location: [facility];  Service: General;  Laterality: Left;  LEFT BREAST MASTECTOMY AND SENTINEL NODE BIOPSY   TONSILLECTOMY  age 75     SOCIAL HISTORY: Social History   Socioeconomic History   Marital status: Single    Spouse name: Not on file   Number of children: 0   Years of education: Not on file   Highest education level: Not on file  Occupational History   Not on file  Tobacco Use   Smoking status: Never   Smokeless tobacco: Never  Vaping Use   Vaping status: Never Used  Substance and Sexual Activity   Alcohol use: Yes    Alcohol/week: 0.0 - 1.0 standard drinks of alcohol    Comment: occasionally   Drug use: No   Sexual activity: Not Currently    Partners: Male    Birth control/protection: Post-menopausal  Other Topics Concern   Not on file  Social History Narrative   Not on file   Social Drivers of Health   Financial Resource Strain: Not on file  Food Insecurity: Low Risk  (01/31/2024)   Received from [facility]   Hunger Vital Sign    Within the past 12 months, you worried that your food would run out before you got money to buy more: Never true    Within the past 12 months, the food you bought just didn't last and you didn't have money to get more. : Never true  Transportation Needs: No Transportation Needs (01/31/2024)   Received from [facility]    In the past 12 months, has lack of reliable transportation kept you  from medical appointments, meetings, work or from getting things needed for daily living? : No  Physical Activity: Not on file  Stress: Not on file  Social Connections: Not on file  Intimate Partner Violence: Not At Risk (11/28/2023)   Humiliation, Afraid, Rape, and Kick questionnaire    Fear of Current or Ex-Partner: No    Emotionally Abused: No    Physically Abused: No    Sexually Abused: No    FAMILY HISTORY: Family History  Problem Relation Age of Onset   Diabetes Mother    Heart disease Mother    Heart disease Father    Heart failure Father    Prostate cancer Brother 39   Lung cancer Paternal Aunt 49       smoked   Multiple myeloma Paternal Uncle    Prostate cancer Maternal Cousin    Colon cancer Neg Hx     ALLERGIES:  has no known allergies.  MEDICATIONS:  Current Outpatient Medications  Medication Sig Dispense Refill   cholecalciferol (VITAMIN D3) 25 MCG (1000 UNIT) tablet Take 1,000 Units by mouth.     dexamethasone  (DECADRON ) 4 MG tablet Take 2 tabs by mouth 2 times daily starting day before chemo. Then  take 2 tabs daily for 2 days starting day after chemo. Take with food. (Patient not taking: Reported on 01/23/2024) 30 tablet 1   losartan  (COZAAR ) 50 MG tablet Take 1 tablet by mouth daily.     Multiple Vitamin (MULTIVITAMIN PO) Take 1 capsule by mouth.      ondansetron  (ZOFRAN ) 8 MG tablet Take 1 tablet (8 mg total) by mouth every 8 (eight) hours as needed for nausea or vomiting. Start on the third day after chemotherapy. (Patient not taking: Reported on 01/23/2024) 30 tablet 1   prochlorperazine  (COMPAZINE ) 10 MG tablet Take 1 tablet (10 mg total) by mouth every 6 (six) hours as needed for nausea or vomiting. (Patient not taking: Reported on 01/23/2024) 30 tablet 1   rosuvastatin (CRESTOR) 10 MG tablet Take 10 mg by mouth.     No current facility-administered medications for this visit.   PHYSICAL EXAMINATION: ECOG PERFORMANCE STATUS: 0 - Asymptomatic  Vitals:    02/18/24 1215  BP: 124/72  Pulse: 76  Resp: 16  Temp: 98.7 F (37.1 C)  SpO2: 96%     Filed Weights   02/18/24 1215  Weight: 244 lb 3.2 oz (110.8 kg)     She appears well, no acute distress CTA bilaterally Rt infusion site with some extravasation. RRR Abdomen: soft, non tender, non distended, no organomegaly No LE edema.  LABORATORY DATA:  I have reviewed the data as listed Lab Results  Component Value Date   WBC 7.1 02/18/2024   HGB 12.7 02/18/2024   HCT 37.6 02/18/2024   MCV 86.4 02/18/2024   PLT 376 02/18/2024   Lab Results  Component Value Date   NA 141 01/23/2024   K 3.9 01/23/2024   CL 106 01/23/2024   CO2 31 01/23/2024    RADIOGRAPHIC STUDIES: I have personally reviewed the radiological reports and agreed with the findings in the report.  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 Primary tumor 6.2 cm, negative margins. All eight axillary nodes positive with extracapsular extension. High recurrence risk given large tumor and 8/8 pos LN.    Breast cancer Currently undergoing chemotherapy with good blood count recovery and no mouth sores. Halfway through the chemotherapy regimen after today's session. - Schedule remaining two chemotherapy sessions. - She will need CDK4/6 inhibitors with AI after completing adj chemo and adj radiation  Chemotherapy-induced nausea and constipation Nausea managed with anti-nausea medication. Constipation is a side effect of the anti-nausea medication. - Take Senna S for constipation.  Chemotherapy-induced arthralgia Joint pain anticipated due to chemotherapy injections. Manage with extra strength Tylenol  if pain is uncontrolled, ensuring no fever is present before taking Tylenol  as it can mask a fever. - Take extra strength Tylenol  for joint pain  if needed, ensuring no fever is present.  Chemotherapy-related fatigue Fatigue is expected and may wax and wane, with the next week potentially being more challenging.  Chemotherapy-induced alopecia Experienced hair loss, managed with wigs and hats.  Follow-up Follow-up for scheduling of chemotherapy sessions discussed.   All questions were answered. The patient knows to call the clinic with any problems, questions or concerns.    Amber Stalls, MD 02/18/24

## 2024-02-18 NOTE — Patient Instructions (Signed)
 [facility] - A DEPT OF [facility]  Discharge Instructions: Thank you for choosing [facility] to provide your oncology and hematology care.   If you have a lab appointment with the Cancer Center, please go directly to the Cancer Center and check in at the registration area.   Wear comfortable clothing and clothing appropriate for easy access to any Portacath or PICC line.   We strive to give you quality time with your provider. You may need to reschedule your appointment if you arrive late (15 or more minutes).  Arriving late affects you and other patients whose appointments are after yours.  Also, if you miss three or more appointments without notifying the office, you may be dismissed from the clinic at the provider's discretion.      For prescription refill requests, have your pharmacy contact our office and allow 72 hours for refills to be completed.    Today you received the following chemotherapy and/or immunotherapy agents taxotere , cytoxan       To help prevent nausea and vomiting after your treatment, we encourage you to take your nausea medication as directed.  BELOW ARE SYMPTOMS THAT SHOULD BE REPORTED IMMEDIATELY: *FEVER GREATER THAN 100.4 F (38 C) OR HIGHER *CHILLS OR SWEATING *NAUSEA AND VOMITING THAT IS NOT CONTROLLED WITH YOUR NAUSEA MEDICATION *UNUSUAL SHORTNESS OF BREATH *UNUSUAL BRUISING OR BLEEDING *URINARY PROBLEMS (pain or burning when urinating, or frequent urination) *BOWEL PROBLEMS (unusual diarrhea, constipation, pain near the anus) TENDERNESS IN MOUTH AND THROAT WITH OR WITHOUT PRESENCE OF ULCERS (sore throat, sores in mouth, or a toothache) UNUSUAL RASH, SWELLING OR PAIN  UNUSUAL VAGINAL DISCHARGE OR ITCHING   Items with * indicate a potential emergency and should be followed up as soon as possible or go to the Emergency Department if any problems should occur.  Please show the CHEMOTHERAPY ALERT CARD or  IMMUNOTHERAPY ALERT CARD at check-in to the Emergency Department and triage nurse.  Should you have questions after your visit or need to cancel or reschedule your appointment, please contact [facility] - A DEPT OF [facility]  Dept: [phone]  and follow the prompts.  Office hours are 8:00 a.m. to 4:30 p.m. Monday - Friday. Please note that voicemails left after 4:00 p.m. may not be returned until the following business day.  We are closed weekends and major holidays. You have access to a nurse at all times for urgent questions. Please call the main number to the clinic Dept: [phone] and follow the prompts.   For any non-urgent questions, you may also contact your provider using MyChart. We now offer e-Visits for anyone 22 and older to request care online for non-urgent symptoms. For details visit [email or web address].   Also download the MyChart app! Go to the app store, search MyChart, open the app, select [facility], and log in with your MyChart username and password.

## 2024-02-18 NOTE — Progress Notes (Signed)
 OK to continue same Cyclophosphamide  dose per Dr. Iruku (1500 mg).  Chrisanna Mishra, Pharm.D., CPP 02/18/2024@1 :22 PM

## 2024-02-19 ENCOUNTER — Telehealth: Payer: Self-pay | Admitting: Hematology and Oncology

## 2024-02-19 ENCOUNTER — Ambulatory Visit

## 2024-02-19 NOTE — Telephone Encounter (Signed)
 I spoke with Bonnie Collins and informed her that per Dr. Loretha she will be fine getting her injection on 11/28 due to the Holiday. She has been made aware of all appointment details.

## 2024-02-20 ENCOUNTER — Inpatient Hospital Stay

## 2024-02-20 VITALS — BP 123/71 | HR 82 | Temp 98.6°F | Resp 18

## 2024-02-20 DIAGNOSIS — Z170 Estrogen receptor positive status [ER+]: Secondary | ICD-10-CM

## 2024-02-20 DIAGNOSIS — C50812 Malignant neoplasm of overlapping sites of left female breast: Secondary | ICD-10-CM | POA: Diagnosis not present

## 2024-02-20 MED ORDER — PEGFILGRASTIM-JMDB 6 MG/0.6ML ~~LOC~~ SOSY
6.0000 mg | PREFILLED_SYRINGE | Freq: Once | SUBCUTANEOUS | Status: AC
  Administered 2024-02-20: 6 mg via SUBCUTANEOUS
  Filled 2024-02-20: qty 0.6

## 2024-02-21 ENCOUNTER — Encounter: Payer: Self-pay | Admitting: *Deleted

## 2024-02-21 DIAGNOSIS — Z170 Estrogen receptor positive status [ER+]: Secondary | ICD-10-CM

## 2024-02-28 ENCOUNTER — Telehealth: Payer: Self-pay

## 2024-02-28 NOTE — Telephone Encounter (Signed)
 Pt called and states she noticed a small rash developing over the weekend on her forehead and around her eye brows. She denies swelling, spreading, pain, but does endorse the occasional itch. She asks if there is something she can use. We advised benadryl  cream to the affected area. She knows to call should it worsen or spread.

## 2024-03-03 ENCOUNTER — Telehealth: Payer: Self-pay | Admitting: *Deleted

## 2024-03-03 NOTE — Telephone Encounter (Signed)
 Pt called stating that she was instructed to use benadryl  cream for rash on forehead. She states she used it and the rash is still there but not worse and hasn't spread. Advise to use OTC hydrocortisone cream to areas for the next 3 days and if rash becomes worse or does not get better, advised to call office. Pt verbalized understanding.

## 2024-03-10 ENCOUNTER — Inpatient Hospital Stay: Admitting: Adult Health

## 2024-03-10 ENCOUNTER — Inpatient Hospital Stay

## 2024-03-10 ENCOUNTER — Inpatient Hospital Stay: Attending: Internal Medicine

## 2024-03-10 ENCOUNTER — Encounter: Payer: Self-pay | Admitting: Adult Health

## 2024-03-10 VITALS — BP 149/69 | HR 77 | Temp 98.3°F | Resp 16 | Wt 245.6 lb

## 2024-03-10 VITALS — BP 139/86 | HR 64 | Resp 16

## 2024-03-10 DIAGNOSIS — C50812 Malignant neoplasm of overlapping sites of left female breast: Secondary | ICD-10-CM

## 2024-03-10 DIAGNOSIS — Z86018 Personal history of other benign neoplasm: Secondary | ICD-10-CM | POA: Diagnosis not present

## 2024-03-10 DIAGNOSIS — Z8249 Family history of ischemic heart disease and other diseases of the circulatory system: Secondary | ICD-10-CM | POA: Insufficient documentation

## 2024-03-10 DIAGNOSIS — Z833 Family history of diabetes mellitus: Secondary | ICD-10-CM | POA: Diagnosis not present

## 2024-03-10 DIAGNOSIS — Z807 Family history of other malignant neoplasms of lymphoid, hematopoietic and related tissues: Secondary | ICD-10-CM | POA: Insufficient documentation

## 2024-03-10 DIAGNOSIS — M1711 Unilateral primary osteoarthritis, right knee: Secondary | ICD-10-CM | POA: Diagnosis not present

## 2024-03-10 DIAGNOSIS — L719 Rosacea, unspecified: Secondary | ICD-10-CM | POA: Diagnosis not present

## 2024-03-10 DIAGNOSIS — Z170 Estrogen receptor positive status [ER+]: Secondary | ICD-10-CM

## 2024-03-10 DIAGNOSIS — M898X9 Other specified disorders of bone, unspecified site: Secondary | ICD-10-CM | POA: Insufficient documentation

## 2024-03-10 DIAGNOSIS — N6489 Other specified disorders of breast: Secondary | ICD-10-CM | POA: Insufficient documentation

## 2024-03-10 DIAGNOSIS — Z801 Family history of malignant neoplasm of trachea, bronchus and lung: Secondary | ICD-10-CM | POA: Diagnosis not present

## 2024-03-10 DIAGNOSIS — T451X5A Adverse effect of antineoplastic and immunosuppressive drugs, initial encounter: Secondary | ICD-10-CM | POA: Diagnosis not present

## 2024-03-10 DIAGNOSIS — Z5111 Encounter for antineoplastic chemotherapy: Secondary | ICD-10-CM | POA: Diagnosis present

## 2024-03-10 DIAGNOSIS — M1811 Unilateral primary osteoarthritis of first carpometacarpal joint, right hand: Secondary | ICD-10-CM | POA: Diagnosis not present

## 2024-03-10 DIAGNOSIS — Z17411 Hormone receptor positive with human epidermal growth factor receptor 2 negative status: Secondary | ICD-10-CM | POA: Diagnosis not present

## 2024-03-10 DIAGNOSIS — M19041 Primary osteoarthritis, right hand: Secondary | ICD-10-CM | POA: Diagnosis not present

## 2024-03-10 DIAGNOSIS — R0981 Nasal congestion: Secondary | ICD-10-CM | POA: Insufficient documentation

## 2024-03-10 DIAGNOSIS — Z9012 Acquired absence of left breast and nipple: Secondary | ICD-10-CM | POA: Insufficient documentation

## 2024-03-10 DIAGNOSIS — Z79899 Other long term (current) drug therapy: Secondary | ICD-10-CM | POA: Diagnosis not present

## 2024-03-10 DIAGNOSIS — H04209 Unspecified epiphora, unspecified lacrimal gland: Secondary | ICD-10-CM | POA: Diagnosis not present

## 2024-03-10 DIAGNOSIS — L309 Dermatitis, unspecified: Secondary | ICD-10-CM | POA: Insufficient documentation

## 2024-03-10 LAB — CMP (CANCER CENTER ONLY)
ALT: 11 U/L (ref 0–44)
AST: 14 U/L — ABNORMAL LOW (ref 15–41)
Albumin: 4.3 g/dL (ref 3.5–5.0)
Alkaline Phosphatase: 64 U/L (ref 38–126)
Anion gap: 7 (ref 5–15)
BUN: 13 mg/dL (ref 8–23)
CO2: 27 mmol/L (ref 22–32)
Calcium: 9.7 mg/dL (ref 8.9–10.3)
Chloride: 106 mmol/L (ref 98–111)
Creatinine: 0.79 mg/dL (ref 0.44–1.00)
GFR, Estimated: >60 mL/min (ref >60)
Glucose, Bld: 97 mg/dL (ref 70–99)
Potassium: 4 mmol/L (ref 3.5–5.1)
Sodium: 140 mmol/L (ref 135–145)
Total Bilirubin: 0.6 mg/dL (ref 0.0–1.2)
Total Protein: 7.3 g/dL (ref 6.5–8.1)

## 2024-03-10 LAB — CBC WITH DIFFERENTIAL (CANCER CENTER ONLY)
Abs Immature Granulocytes: 0.02 K/uL (ref 0.00–0.07)
Basophils Absolute: 0 K/uL (ref 0.0–0.1)
Basophils Relative: 0 %
Eosinophils Absolute: 0 K/uL (ref 0.0–0.5)
Eosinophils Relative: 0 %
HCT: 38.2 % (ref 36.0–46.0)
Hemoglobin: 12.4 g/dL (ref 12.0–15.0)
Immature Granulocytes: 0 %
Lymphocytes Relative: 17 %
Lymphs Abs: 1.2 K/uL (ref 0.7–4.0)
MCH: 28.8 pg (ref 26.0–34.0)
MCHC: 32.5 g/dL (ref 30.0–36.0)
MCV: 88.6 fL (ref 80.0–100.0)
Monocytes Absolute: 0.9 K/uL (ref 0.1–1.0)
Monocytes Relative: 13 %
Neutro Abs: 5.2 K/uL (ref 1.7–7.7)
Neutrophils Relative %: 70 %
Platelet Count: 268 K/uL (ref 150–400)
RBC: 4.31 MIL/uL (ref 3.87–5.11)
RDW: 16.2 % — ABNORMAL HIGH (ref 11.5–15.5)
WBC Count: 7.4 K/uL (ref 4.0–10.5)
nRBC: 0 % (ref 0.0–0.2)

## 2024-03-10 MED ORDER — SODIUM CHLORIDE 0.9 % IV SOLN
600.0000 mg/m2 | Freq: Once | INTRAVENOUS | Status: AC
  Administered 2024-03-10: 1500 mg via INTRAVENOUS
  Filled 2024-03-10: qty 75

## 2024-03-10 MED ORDER — SODIUM CHLORIDE 0.9 % IV SOLN: INTRAVENOUS | Status: DC

## 2024-03-10 MED ORDER — PALONOSETRON HCL INJECTION 0.25 MG/5ML
0.2500 mg | Freq: Once | INTRAVENOUS | Status: AC
  Administered 2024-03-10: 0.25 mg via INTRAVENOUS
  Filled 2024-03-10: qty 5

## 2024-03-10 MED ORDER — DEXAMETHASONE SOD PHOSPHATE PF 10 MG/ML IJ SOLN
10.0000 mg | Freq: Once | INTRAMUSCULAR | Status: AC
  Administered 2024-03-10: 10 mg via INTRAVENOUS

## 2024-03-10 MED ORDER — SODIUM CHLORIDE 0.9 % IV SOLN
75.0000 mg/m2 | Freq: Once | INTRAVENOUS | Status: AC
  Administered 2024-03-10: 160 mg via INTRAVENOUS
  Filled 2024-03-10: qty 16

## 2024-03-10 NOTE — Progress Notes (Unsigned)
 [facility] Cancer Follow up:    Lynwood Laneta ORN, PA-C [address] [place] [address]   DIAGNOSIS: Cancer Staging  Malignant neoplasm of overlapping sites of left breast in female, estrogen receptor positive (HCC) Staging form: Breast, AJCC 8th Edition - Pathologic: Stage IB (pT3, pN2a(sn), cM0, G2, ER+, PR+, HER2-) - Signed by Loretha Ash, MD on 12/24/2023 Method of lymph node assessment: Sentinel lymph node biopsy Multigene prognostic tests performed: None Histologic grading system: 3 grade system    SUMMARY OF ONCOLOGIC HISTORY: Oncology History  Malignant neoplasm of overlapping sites of left breast in female, estrogen receptor positive (HCC)  11/13/2023 Mammogram   Mammogram with asymmetric increase in fibroglandular tissue in left breast which is indeterminate.  No sonographic correlate or abnormality to account for the asymmetric increase in the glandular tissue in the left breast.  Given this can be a subtle presentation of invasive lobular carcinoma contrast-enhanced mammogram is recommended  Contrast-enhanced imaging was successful and without incident this showed scattered non-mass enhancement throughout the left breast, anterior to middle depth.   11/21/2023 Pathology Results   Left breast needle core biopsy at 12:00 4 cm from the nipple showed overall grade 2 invasive mammary carcinoma. Left breast needle core biopsy retroareolar at 4:00 showed overall grade 2 invasive mammary carcinoma prognostic showed ER 95% positive strong staining intensity PR 50% positive staining intensity HER2 0 and Ki-67 of 20%   11/27/2023 Initial Diagnosis   Malignant neoplasm of overlapping sites of left breast in female, estrogen receptor positive (HCC)    Genetic Testing   [facility] CancerNext Panel+RNA was Negative. Report date is 12/09/2023.  The [facility] CancerNext+RNAinsight Panel includes sequencing, rearrangement analysis, and RNA analysis for the following 40 genes:  APC, ATM, BAP1, BARD1, BMPR1A, BRCA1, BRCA2, BRIP1, CDH1, CDKN2A, CHEK2, FH, FLCN, MET, MLH1, MSH2, MSH6, MUTYH, NF1, NTHL1, PALB2, PMS2, PTEN, RAD51C, RAD51D, RPS20, SMAD4, STK11, TP53, TSC1, TSC2, and VHL (sequencing and deletion/duplication); AXIN2, HOXB13, MBD4, MSH3, POLD1 and POLE (sequencing only); EPCAM and GREM1 (deletion/duplication only).    11/28/2023 Cancer Staging   Staging form: Breast, AJCC 8th Edition - Pathologic: Stage IB (pT3, pN2a(sn), cM0, G2, ER+, PR+, HER2-) - Signed by Loretha Ash, MD on 12/24/2023 Method of lymph node assessment: Sentinel lymph node biopsy Multigene prognostic tests performed: None Histologic grading system: 3 grade system   01/25/2024 -  Chemotherapy   Patient is on Treatment Plan : BREAST TC q21d       CURRENT THERAPY:  INTERVAL HISTORY:  Discussed the use of AI scribe software for clinical note transcription with the patient, who gave verbal consent to proceed.  History of Present Illness     Patient Active Problem List   Diagnosis Date Noted   Genetic testing 12/10/2023   Malignant neoplasm of overlapping sites of left breast in female, estrogen receptor positive (HCC) 11/27/2023   Well woman exam with routine gynecological exam 09/06/2023   Mixed incontinence 09/06/2023   Arthritis of right knee 11/24/2020   Benign neoplasm of skin of face 11/24/2020   Dermatitis 11/24/2020   Primary osteoarthritis of first carpometacarpal joint of right hand 04/07/2020   Rupture of extensor tendon of right hand 04/07/2020   Scapholunate dissociation, right 04/07/2020   Chronic pain of right knee 12/10/2019   Pure hypercholesterolemia 05/14/2018   Hallux valgus 11/25/2015   Reactive airway disease 10/18/2015   Obesity 10/18/2015   Inflamed seborrheic keratosis 10/18/2015   History of breast cancer 10/18/2015   DCIS (ductal  carcinoma in situ)     has no known allergies.  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; s/p lumpectomy; s/p adj XRT; on adj Tamoxifen    Hypertension    Sinus problem     SURGICAL HISTORY: Past Surgical History:  Procedure Laterality Date   BREAST LUMPECTOMY Left 05/09/2007   CARPAL TUNNEL RELEASE Bilateral 1993 & 2003   MASTECTOMY W/ SENTINEL NODE BIOPSY Left 12/13/2023   Procedure: MASTECTOMY WITH SENTINEL LYMPH NODE BIOPSY;  Surgeon: Vernetta Berg, MD;  Location:  SURGERY CENTER;  Service: General;  Laterality: Left;  LEFT BREAST MASTECTOMY AND SENTINEL NODE BIOPSY   TONSILLECTOMY  age 32     SOCIAL HISTORY: Social History   Socioeconomic History   Marital status: Single    Spouse name: Not on file   Number of children: 0   Years of education: Not on file   Highest education level: Not on file  Occupational History   Not on file  Tobacco Use   Smoking status: Never   Smokeless tobacco: Never  Vaping Use   Vaping status: Never Used  Substance and Sexual Activity   Alcohol use: Yes    Alcohol/week: 0.0 - 1.0 standard drinks of alcohol    Comment: occasionally   Drug use: No   Sexual activity: Not Currently    Partners: Male    Birth control/protection: Post-menopausal  Other Topics Concern   Not on file  Social History Narrative   Not on file   Social Drivers of Health   Financial Resource Strain: Not on file  Food Insecurity: Low Risk  (01/31/2024)   Received from [facility]   Hunger Vital Sign    Within the past 12 months, you worried that your food would run out before you got money to buy more: Never true    Within the past 12 months, the food you bought just didn't last and you didn't have money to get more. : Never true  Transportation Needs: No Transportation Needs (01/31/2024)   Received from [facility]    In the past 12 months, has lack of reliable transportation kept you from medical appointments, meetings,  work or from getting things needed for daily living? : No  Physical Activity: Not on file  Stress: Not on file  Social Connections: Not on file  Intimate Partner Violence: Not At Risk (11/28/2023)   Humiliation, Afraid, Rape, and Kick questionnaire    Fear of Current or Ex-Partner: No    Emotionally Abused: No    Physically Abused: No    Sexually Abused: No    FAMILY HISTORY: Family History  Problem Relation Age of Onset   Diabetes Mother    Heart disease Mother    Heart disease Father    Heart failure Father    Prostate cancer Brother 23   Lung cancer Paternal Aunt 92       smoked   Multiple myeloma Paternal Uncle    Prostate cancer Maternal Cousin    Colon cancer Neg Hx     Review of Systems  Constitutional:  Negative for appetite change, chills, fatigue, fever and unexpected weight change.  HENT:   Negative for hearing loss, lump/mass and trouble swallowing.   Eyes:  Negative for eye problems and icterus.  Respiratory:  Negative for chest tightness, cough and shortness of breath.  Cardiovascular:  Negative for chest pain, leg swelling and palpitations.  Gastrointestinal:  Negative for abdominal distention, abdominal pain, constipation, diarrhea, nausea and vomiting.  Endocrine: Negative for hot flashes.  Genitourinary:  Negative for difficulty urinating.   Musculoskeletal:  Negative for arthralgias.  Skin:  Negative for itching and rash.  Neurological:  Negative for dizziness, extremity weakness, headaches and numbness.  Hematological:  Negative for adenopathy. Does not bruise/bleed easily.  Psychiatric/Behavioral:  Negative for depression. The patient is not nervous/anxious.       PHYSICAL EXAMINATION   Onc Performance Status - 03/10/24 1344       ECOG Perf Status   ECOG Perf Status Restricted in physically strenuous activity but ambulatory and able to carry out work of a light or sedentary nature, e.g., light house work, office work (P)       KPS SCALE   KPS  % SCORE Able to carry on normal activity, minor s/s of disease (P)           Vitals:   03/10/24 1339  BP: (!) 149/69  Pulse: 77  Resp: 16  Temp: 98.3 F (36.8 C)  SpO2: 97%    Physical Exam Constitutional:      General: She is not in acute distress.    Appearance: Normal appearance. She is not toxic-appearing.  HENT:     Head: Normocephalic and atraumatic.     Mouth/Throat:     Mouth: Mucous membranes are moist.     Pharynx: Oropharynx is clear. No oropharyngeal exudate or posterior oropharyngeal erythema.  Eyes:     General: No scleral icterus. Cardiovascular:     Rate and Rhythm: Normal rate and regular rhythm.     Pulses: Normal pulses.     Heart sounds: Normal heart sounds.  Pulmonary:     Effort: Pulmonary effort is normal.     Breath sounds: Normal breath sounds.  Abdominal:     General: Abdomen is flat. Bowel sounds are normal. There is no distension.     Palpations: Abdomen is soft.     Tenderness: There is no abdominal tenderness.  Musculoskeletal:        General: No swelling.     Cervical back: Neck supple.  Lymphadenopathy:     Cervical: No cervical adenopathy.  Skin:    General: Skin is warm and dry.     Findings: No rash.  Neurological:     General: No focal deficit present.     Mental Status: She is alert.  Psychiatric:        Mood and Affect: Mood normal.        Behavior: Behavior normal.     LABORATORY DATA:  CBC    Component Value Date/Time   WBC 7.4 03/10/2024 1309   WBC 4.3 11/20/2013 1251   WBC 5.5 12/05/2007 1604   RBC 4.31 03/10/2024 1309   HGB 12.4 03/10/2024 1309   HGB 14.7 01/12/2015 1057   HGB 13.6 11/20/2013 1251   HCT 38.2 03/10/2024 1309   HCT 42.2 11/20/2013 1251   PLT 268 03/10/2024 1309   PLT 201 11/20/2013 1251   MCV 88.6 03/10/2024 1309   MCV 87.9 11/20/2013 1251   MCH 28.8 03/10/2024 1309   MCHC 32.5 03/10/2024 1309   RDW 16.2 (H) 03/10/2024 1309   RDW 14.6 (H) 11/20/2013 1251   LYMPHSABS 1.2 03/10/2024  1309   LYMPHSABS 1.3 11/20/2013 1251   MONOABS 0.9 03/10/2024 1309   MONOABS 0.3 11/20/2013 1251   EOSABS 0.0  03/10/2024 1309   EOSABS 0.1 11/20/2013 1251   BASOSABS 0.0 03/10/2024 1309   BASOSABS 0.0 11/20/2013 1251    CMP     Component Value Date/Time   NA 140 03/10/2024 1309   NA 144 11/20/2013 1251   K 4.0 03/10/2024 1309   K 4.0 11/20/2013 1251   CL 106 03/10/2024 1309   CL 107 03/20/2012 1257   CO2 27 03/10/2024 1309   CO2 28 11/20/2013 1251   GLUCOSE 97 03/10/2024 1309   GLUCOSE 141 (H) 11/20/2013 1251   GLUCOSE 92 03/20/2012 1257   BUN 13 03/10/2024 1309   BUN 13.3 11/20/2013 1251   CREATININE 0.79 03/10/2024 1309   CREATININE 0.9 11/20/2013 1251   CALCIUM 9.7 03/10/2024 1309   CALCIUM 9.6 11/20/2013 1251   PROT 7.3 03/10/2024 1309   PROT 6.8 11/20/2013 1251   ALBUMIN 4.3 03/10/2024 1309   ALBUMIN 3.6 11/20/2013 1251   AST 14 (L) 03/10/2024 1309   AST 16 11/20/2013 1251   ALT 11 03/10/2024 1309   ALT 13 11/20/2013 1251   ALKPHOS 64 03/10/2024 1309   ALKPHOS 72 11/20/2013 1251   BILITOT 0.6 03/10/2024 1309   BILITOT 0.35 11/20/2013 1251   GFRNONAA >60 03/10/2024 1309   GFRAA  12/05/2007 1604    >60        The eGFR has been calculated using the MDRD equation. This calculation has not been validated in all clinical     ASSESSMENT and THERAPY PLAN:   Assessment and Plan Assessment & Plan       All questions were answered. The patient knows to call the clinic with any problems, questions or concerns. We can certainly see the patient much sooner if necessary.  Total encounter time:*** minutes*in face-to-face visit time, chart review, lab review, care coordination, order entry, and documentation of the encounter time.    Morna Kendall, NP 03/10/24 2:11 PM Medical Oncology and Hematology [facility] [address] [place], [place] [address] Tel. [phone]    Fax. [phone]  *Total Encounter Time as defined by the Centers  for Medicare and Medicaid Services includes, in addition to the face-to-face time of a patient visit (documented in the note above) non-face-to-face time: obtaining and reviewing outside history, ordering and reviewing medications, tests or procedures, care coordination (communications with other health care professionals or caregivers) and documentation in the medical record.

## 2024-03-10 NOTE — Patient Instructions (Signed)
 [facility] - A DEPT OF [facility]  Discharge Instructions: Thank you for choosing [facility] to provide your oncology and hematology care.   If you have a lab appointment with the Cancer Center, please go directly to the Cancer Center and check in at the registration area.   Wear comfortable clothing and clothing appropriate for easy access to any Portacath or PICC line.   We strive to give you quality time with your provider. You may need to reschedule your appointment if you arrive late (15 or more minutes).  Arriving late affects you and other patients whose appointments are after yours.  Also, if you miss three or more appointments without notifying the office, you may be dismissed from the clinic at the provider's discretion.      For prescription refill requests, have your pharmacy contact our office and allow 72 hours for refills to be completed.    Today you received the following chemotherapy and/or immunotherapy agents taxotere , cytoxan       To help prevent nausea and vomiting after your treatment, we encourage you to take your nausea medication as directed.  BELOW ARE SYMPTOMS THAT SHOULD BE REPORTED IMMEDIATELY: *FEVER GREATER THAN 100.4 F (38 C) OR HIGHER *CHILLS OR SWEATING *NAUSEA AND VOMITING THAT IS NOT CONTROLLED WITH YOUR NAUSEA MEDICATION *UNUSUAL SHORTNESS OF BREATH *UNUSUAL BRUISING OR BLEEDING *URINARY PROBLEMS (pain or burning when urinating, or frequent urination) *BOWEL PROBLEMS (unusual diarrhea, constipation, pain near the anus) TENDERNESS IN MOUTH AND THROAT WITH OR WITHOUT PRESENCE OF ULCERS (sore throat, sores in mouth, or a toothache) UNUSUAL RASH, SWELLING OR PAIN  UNUSUAL VAGINAL DISCHARGE OR ITCHING   Items with * indicate a potential emergency and should be followed up as soon as possible or go to the Emergency Department if any problems should occur.  Please show the CHEMOTHERAPY ALERT CARD or  IMMUNOTHERAPY ALERT CARD at check-in to the Emergency Department and triage nurse.  Should you have questions after your visit or need to cancel or reschedule your appointment, please contact [facility] - A DEPT OF [facility]  Dept: [phone]  and follow the prompts.  Office hours are 8:00 a.m. to 4:30 p.m. Monday - Friday. Please note that voicemails left after 4:00 p.m. may not be returned until the following business day.  We are closed weekends and major holidays. You have access to a nurse at all times for urgent questions. Please call the main number to the clinic Dept: [phone] and follow the prompts.   For any non-urgent questions, you may also contact your provider using MyChart. We now offer e-Visits for anyone 22 and older to request care online for non-urgent symptoms. For details visit [email or web address].   Also download the MyChart app! Go to the app store, search MyChart, open the app, select [facility], and log in with your MyChart username and password.

## 2024-03-11 ENCOUNTER — Encounter: Payer: Self-pay | Admitting: Hematology and Oncology

## 2024-03-12 ENCOUNTER — Inpatient Hospital Stay

## 2024-03-12 VITALS — BP 145/78 | HR 72 | Resp 17

## 2024-03-12 DIAGNOSIS — C50812 Malignant neoplasm of overlapping sites of left female breast: Secondary | ICD-10-CM

## 2024-03-12 MED ORDER — PEGFILGRASTIM-JMDB 6 MG/0.6ML ~~LOC~~ SOSY
6.0000 mg | PREFILLED_SYRINGE | Freq: Once | SUBCUTANEOUS | Status: AC
  Administered 2024-03-12: 6 mg via SUBCUTANEOUS
  Filled 2024-03-12: qty 0.6

## 2024-03-14 ENCOUNTER — Telehealth: Payer: Self-pay

## 2024-03-14 NOTE — Telephone Encounter (Signed)
 S/w patient regarding recent symptoms. Patient noted that she had third chemotherapy treatment on Monday and Fulphila  injection on Wednesday. Patient noted feeling achy yesterday evening, but was afebrile at the time.  Patient awoke this morning and had a temperature of 100.1 F. Patient has recently rechecked her temperature and it is now 99.1 F. Patient denies any sick contacts, chills, cough, or headaches at this time. Patient has not taken any medication that could reduce a fever. Patient encouraged to continue monitoring her temperature and to report back to the office should she have any new symptoms and/or a fever of 100.4 F or greater.  Patient knows that she can take tylenol  for any body aches, which is likely a result from the G-CSF injection.  Patient verbalized an understanding of the information.

## 2024-03-17 ENCOUNTER — Ambulatory Visit: Attending: Surgery | Admitting: Physical Therapy

## 2024-03-17 DIAGNOSIS — C50412 Malignant neoplasm of upper-outer quadrant of left female breast: Secondary | ICD-10-CM | POA: Insufficient documentation

## 2024-03-17 DIAGNOSIS — Z483 Aftercare following surgery for neoplasm: Secondary | ICD-10-CM | POA: Insufficient documentation

## 2024-03-17 DIAGNOSIS — Z170 Estrogen receptor positive status [ER+]: Secondary | ICD-10-CM | POA: Insufficient documentation

## 2024-03-17 NOTE — Therapy (Signed)
  OUTPATIENT PHYSICAL THERAPY SOZO SCREENING NOTE   Patient Name: Bonnie Collins MRN: [id number] DOB:06-06-1950, 73 y.o., female Today's Date: 03/17/2024  PCP: Lynwood Laneta ORN, PA-C REFERRING PROVIDER: Vernetta Berg, MD   PT End of Session - 03/17/24 1109     Visit Number 2   screen   PT Start Time 1105    PT Stop Time 1110    PT Time Calculation (min) 5 min    Activity Tolerance Patient tolerated treatment well    Behavior During Therapy [facility] for tasks assessed/performed          Past Medical History:  Diagnosis Date   Arthritis    Carpal tunnel syndrome    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; s/p lumpectomy; s/p adj XRT; on adj Tamoxifen    Hypertension    Sinus problem    Past Surgical History:  Procedure Laterality Date   BREAST LUMPECTOMY Left 05/09/2007   CARPAL TUNNEL RELEASE Bilateral 1993 & 2003   MASTECTOMY W/ SENTINEL NODE BIOPSY Left 12/13/2023   Procedure: MASTECTOMY WITH SENTINEL LYMPH NODE BIOPSY;  Surgeon: Vernetta Berg, MD;  Location: [facility];  Service: General;  Laterality: Left;  LEFT BREAST MASTECTOMY AND SENTINEL NODE BIOPSY   TONSILLECTOMY  age 53    Patient Active Problem List   Diagnosis Date Noted   Genetic testing 12/10/2023   Malignant neoplasm of overlapping sites of left breast in female, estrogen receptor positive (HCC) 11/27/2023   Well woman exam with routine gynecological exam 09/06/2023   Mixed incontinence 09/06/2023   Arthritis of right knee 11/24/2020   Benign neoplasm of skin of face 11/24/2020   Dermatitis 11/24/2020   Primary osteoarthritis of first carpometacarpal joint of right hand 04/07/2020   Rupture of extensor tendon of right hand 04/07/2020   Scapholunate dissociation, right 04/07/2020   Chronic pain of right knee 12/10/2019   Pure hypercholesterolemia 05/14/2018   Hallux valgus 11/25/2015   Reactive airway disease 10/18/2015   Obesity 10/18/2015   Inflamed  seborrheic keratosis 10/18/2015   History of breast cancer 10/18/2015   DCIS (ductal carcinoma in situ)     REFERRING DIAG: left breast cancer at risk for lymphedema  THERAPY DIAG:  Malignant neoplasm of upper-outer quadrant of left breast in female, estrogen receptor positive (HCC)  Aftercare following surgery for neoplasm  PERTINENT HISTORY: Patient was diagnosed on 11/13/2023 with left grade 2 invasive lobular carcinoma breast cancer. She underwent a left mastectomy and axillary lymph node dissection (8 of 8 nodes positive) on 12/13/2023. Final pathology showed a 6.2 cm mass which was grade II invasive lobular carcinoma and ER/PR positive, HER2 negative, with a Ki67 of 20%.She has a history of left breast cancer DCIS in 2009 treated with a lumpectomy and radiation.   PRECAUTIONS: left UE Lymphedema risk  SUBJECTIVE: In chemo now  PAIN:  Are you having pain? No  SOZO SCREENING: Patient was assessed today using the SOZO machine to determine the lymphedema index score. This was compared to her baseline score. It was determined that she is within the recommended range when compared to her baseline and no further action is needed at this time. She will continue SOZO screenings. These are done every 3 months for 2 years post operatively followed by every 6 months for 2 years, and then annually.     Larue Saddie SAUNDERS, PT 03/17/2024, 11:10 AM

## 2024-03-19 ENCOUNTER — Telehealth: Payer: Self-pay

## 2024-03-19 ENCOUNTER — Inpatient Hospital Stay

## 2024-03-19 NOTE — Telephone Encounter (Signed)
 Pt called and reports bloating and asks if it is OK to take OTC Gas-x. Advised it is OK to take per MD, and to avoid drinking from a straw right now as that can cause more gas.   She also verbalized concern for what she explains as a canker sore to the corner of her mouth. Denies clusters, pain, itching, tingling and states it is not bothersome. Recommended pt to stay hydrated, avoid acidic foods/drinks and use aquaphor to treat. She reports she does not have  a hx of fever blisters but that if any of the above named symptoms occur she will call us  back to discuss antiviral medication with MD.

## 2024-03-19 NOTE — Progress Notes (Signed)
 [facility] Clinical Social Work  Dietitian made contact: Yes   Method of contact:  Phone, Text, and In-person  Has the connection been helpful? Yes  If yes, how so? feeling less alone , answered my questions about what to expect, decreased anxiety, and feeling more hopeful   Other feedback about your mentor or the Alight Guide program? Patient reports: My peer mentor has been absolutely lovely. She called right away, and checks in at every treatment. Patient states that she and peer mentor met up for lunch after her 2nd round of chemo, and teared up when she shared mentor brought her a pink basket of useful gifts and books. She states, She is a very thoughtful person. Thank you for connecting us .     Other Psychosocial Needs: Patient reported stressors: Patient reports she felt mentally strong during first two rounds of Tx, but feels the need now for additional support. CSW Intern informed patient of other support services offered with info on how to register, and encouraged her to attend when she is ready.  Current SDOH Barriers: None identified at this time.     Interventions: Discussed common feeling and emotions when being diagnosed with cancer, and the importance of support during treatment Informed patient of the support team roles and support services at [facility] Provided CSW contact information and encouraged patient to call with any questions or concerns Registered patient for breast cancer support group.     Thersia KATHEE Daring  Clinical Social Work Intern  [facility]

## 2024-03-19 NOTE — Progress Notes (Signed)
 I reviewed patient visit by social work Tax inspector. I concur with the treatment plan as documented in the SW intern's note.   Nazareth Norenberg E Lylianna Fraiser, LCSW Clinical Child psychotherapist

## 2024-03-24 ENCOUNTER — Inpatient Hospital Stay: Admitting: Physician Assistant

## 2024-03-24 ENCOUNTER — Telehealth: Payer: Self-pay | Admitting: *Deleted

## 2024-03-24 VITALS — BP 156/81 | HR 75 | Temp 98.1°F | Resp 15 | Wt 244.2 lb

## 2024-03-24 DIAGNOSIS — Z170 Estrogen receptor positive status [ER+]: Secondary | ICD-10-CM

## 2024-03-24 DIAGNOSIS — C50812 Malignant neoplasm of overlapping sites of left female breast: Secondary | ICD-10-CM

## 2024-03-24 DIAGNOSIS — R21 Rash and other nonspecific skin eruption: Secondary | ICD-10-CM

## 2024-03-24 MED ORDER — HYDROCORTISONE 1 % EX LOTN: 1.0000 | TOPICAL_LOTION | Freq: Two times a day (BID) | CUTANEOUS | 0 refills | Status: AC

## 2024-03-24 NOTE — Telephone Encounter (Signed)
 Pt called stating that she had recurrence of rash but it is on eyelids, causing swelling. Advice pt not to apply cream but apply ice until she could be evaluated. Pt will see [facility] on today at 2pm. Made her aware of appt and time. Pt verbalized understanding.

## 2024-03-24 NOTE — Progress Notes (Signed)
 Symptom Management Consult Note [facility]    Patient Care Team: James, Natalie W, PA-C as PCP - General (Family Medicine) Lonn Hicks, MD as Consulting Physician (Hematology and Oncology) Jertson, Jill Evelyn, MD as Consulting Physician (Obstetrics and Gynecology) Tyree Nanetta SAILOR, RN as Oncology Nurse Navigator Gerome, Devere HERO, RN as Oncology Nurse Navigator Loretha Ash, MD as Consulting Physician (Hematology and Oncology) Shannon Agent, MD as Consulting Physician (Radiation Oncology) Vernetta Berg, MD as Consulting Physician (General Surgery)    Name / MRN / DOB: Bonnie Collins  [id number]  03-Dec-1950   Date of visit: 03/24/2024   Chief Complaint/Reason for visit: rash   Current Therapy: taxotere /cytoxan    Last treatment:  Day 1   Cycle 3 on 03/10/24    ASSESSMENT AND PLAN Patient is a 73 y.o. female with oncologic history of malignant neoplasm of overlapping sites of left breast in female, estrogen receptor positive followed by Dr. Loretha.  I have viewed most recent oncology note and lab work.  #Malignant neoplasm of overlapping sites of left breast in female, estrogen receptor positive  - Next appointment with oncologist is 04/01/24  #Chemotherapy-induced dermatitis - Rash likely due to Taxotere , with mild itching and no systemic symptoms. Patient only has 1 treatment remaining. - Prescribed hydrocortisone cream, apply thin layer twice daily to affected areas, avoid eyelids. - Advised against using Aquaphor with hydrocortisone. - Recommended OTC antihistamines for itching if needed. - Advised to monitor rash and report if it worsens or spreads. Will notify oncologist of rash as well.   Strict ED precautions discussed should symptoms worsen.   HEME/ONC HISTORY Oncology History  Malignant neoplasm of overlapping sites of left breast in female, estrogen receptor positive (HCC)  11/13/2023 Mammogram   Mammogram with asymmetric increase in  fibroglandular tissue in left breast which is indeterminate.  No sonographic correlate or abnormality to account for the asymmetric increase in the glandular tissue in the left breast.  Given this can be a subtle presentation of invasive lobular carcinoma contrast-enhanced mammogram is recommended  Contrast-enhanced imaging was successful and without incident this showed scattered non-mass enhancement throughout the left breast, anterior to middle depth.   11/21/2023 Pathology Results   Left breast needle core biopsy at 12:00 4 cm from the nipple showed overall grade 2 invasive mammary carcinoma. Left breast needle core biopsy retroareolar at 4:00 showed overall grade 2 invasive mammary carcinoma prognostic showed ER 95% positive strong staining intensity PR 50% positive staining intensity HER2 0 and Ki-67 of 20%   11/27/2023 Initial Diagnosis   Malignant neoplasm of overlapping sites of left breast in female, estrogen receptor positive (HCC)    Genetic Testing   [facility] CancerNext Panel+RNA was Negative. Report date is 12/09/2023.  The [facility] CancerNext+RNAinsight Panel includes sequencing, rearrangement analysis, and RNA analysis for the following 40 genes: APC, ATM, BAP1, BARD1, BMPR1A, BRCA1, BRCA2, BRIP1, CDH1, CDKN2A, CHEK2, FH, FLCN, MET, MLH1, MSH2, MSH6, MUTYH, NF1, NTHL1, PALB2, PMS2, PTEN, RAD51C, RAD51D, RPS20, SMAD4, STK11, TP53, TSC1, TSC2, and VHL (sequencing and deletion/duplication); AXIN2, HOXB13, MBD4, MSH3, POLD1 and POLE (sequencing only); EPCAM and GREM1 (deletion/duplication only).    11/28/2023 Cancer Staging   Staging form: Breast, AJCC 8th Edition - Pathologic: Stage IB (pT3, pN2a(sn), cM0, G2, ER+, PR+, HER2-) - Signed by Loretha Ash, MD on 12/24/2023 Method of lymph node assessment: Sentinel lymph node biopsy Multigene prognostic tests performed: None Histologic grading system: 3 grade system   01/25/2024 -  Chemotherapy   Patient  is on Treatment Plan : BREAST TC q21d          INTERVAL HISTORY  Discussed the use of AI scribe software for clinical note transcription with the patient, who gave verbal consent to proceed.    Bonnie Collins is a 73 y.o. female with oncologic history as above presenting to [facility] today with chief complaint of rash. Patient presents unaccompanied to visit today.  She developed a rash about a week after her third chemotherapy treatment on November 3rd. The rash initially appeared red and was more severe after her second treatment but improved with Neosporin. This time, she applied Neosporin as soon as she noticed a pinkish hue, preventing it from worsening. However, she feels it is not improving as expected and notes itching on her right eyelid, which she has not treated due to its proximity to her eye.  She experienced fever, chills, and body aches for one night after the third treatment, which resolved by the next morning and thought to be related to G-CSF injection.  Her current medications include Claritin, which she takes for seven days following each injection, and occasional use of Flonase for nasal congestion. She has a history of using Metronidazole gel for rosacea, but has not used it recently as it was ineffective in the past.  No current fever or chills. The rash is not significantly itchy, except occasionally on her eyelid at night, which she manages with a cold compress. She denies any respiratory of GI symptoms. Rsah is localized to her face.     ROS  All other systems are reviewed and are negative for acute change except as noted in the HPI.    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; s/p lumpectomy; s/p adj XRT; on adj Tamoxifen    Hypertension    Sinus problem      Past Surgical History:  Procedure Laterality Date   BREAST LUMPECTOMY Left 05/09/2007   CARPAL TUNNEL RELEASE  Bilateral 1993 & 2003   MASTECTOMY W/ SENTINEL NODE BIOPSY Left 12/13/2023   Procedure: MASTECTOMY WITH SENTINEL LYMPH NODE BIOPSY;  Surgeon: Vernetta Berg, MD;  Location: [facility];  Service: General;  Laterality: Left;  LEFT BREAST MASTECTOMY AND SENTINEL NODE BIOPSY   TONSILLECTOMY  age 17     Social History   Socioeconomic History   Marital status: Single    Spouse name: Not on file   Number of children: 0   Years of education: Not on file   Highest education level: Not on file  Occupational History   Not on file  Tobacco Use   Smoking status: Never   Smokeless tobacco: Never  Vaping Use   Vaping status: Never Used  Substance and Sexual Activity   Alcohol use: Yes    Alcohol/week: 0.0 - 1.0 standard drinks of alcohol    Comment: occasionally   Drug use: No   Sexual activity: Not Currently    Partners: Male    Birth control/protection: Post-menopausal  Other Topics Concern   Not on file  Social History Narrative   Not on file   Social Drivers of Health   Financial Resource Strain: Not on file  Food Insecurity: Low Risk  (01/31/2024)   Received from [facility]   Hunger Vital Sign    Within the past 12 months, you worried that your  food would run out before you got money to buy more: Never true    Within the past 12 months, the food you bought just didn't last and you didn't have money to get more. : Never true  Transportation Needs: No Transportation Needs (01/31/2024)   Received from [employer]    In the past 12 months, has lack of reliable transportation kept you from medical appointments, meetings, work or from getting things needed for daily living? : No  Physical Activity: Not on file  Stress: Not on file  Social Connections: Not on file  Intimate Partner Violence: Not At Risk (11/28/2023)   Humiliation, Afraid, Rape, and Kick questionnaire    Fear of Current or Ex-Partner: No    Emotionally Abused: No    Physically  Abused: No    Sexually Abused: No    Family History  Problem Relation Age of Onset   Diabetes Mother    Heart disease Mother    Heart disease Father    Heart failure Father    Prostate cancer Brother 83   Lung cancer Paternal Aunt 61       smoked   Multiple myeloma Paternal Uncle    Prostate cancer Maternal Cousin    Colon cancer Neg Hx      Current Outpatient Medications:    hydrocortisone 1 % lotion, Apply 1 Application topically 2 (two) times daily. Apply thin layer only for 5 days. Do not apply to eye lids., Disp: 118 mL, Rfl: 0   cholecalciferol (VITAMIN D3) 25 MCG (1000 UNIT) tablet, Take 1,000 Units by mouth., Disp: , Rfl:    dexamethasone  (DECADRON ) 4 MG tablet, Take 2 tabs by mouth 2 times daily starting day before chemo. Then take 2 tabs daily for 2 days starting day after chemo. Take with food., Disp: 30 tablet, Rfl: 1   losartan  (COZAAR ) 50 MG tablet, Take 1 tablet by mouth daily., Disp: , Rfl:    Multiple Vitamin (MULTIVITAMIN PO), Take 1 capsule by mouth. , Disp: , Rfl:    ondansetron  (ZOFRAN ) 8 MG tablet, Take 1 tablet (8 mg total) by mouth every 8 (eight) hours as needed for nausea or vomiting. Start on the third day after chemotherapy., Disp: 30 tablet, Rfl: 1   prochlorperazine  (COMPAZINE ) 10 MG tablet, Take 1 tablet (10 mg total) by mouth every 6 (six) hours as needed for nausea or vomiting., Disp: 30 tablet, Rfl: 1   rosuvastatin (CRESTOR) 10 MG tablet, Take 10 mg by mouth., Disp: , Rfl:   PHYSICAL EXAM ECOG FS:1 - Symptomatic but completely ambulatory    Vitals:   03/24/24 1414  BP: (!) 156/81  Pulse: 75  Resp: 15  Temp: 98.1 F (36.7 C)  TempSrc: Temporal  SpO2: 97%  Weight: 244 lb 3.2 oz (110.8 kg)   Physical Exam Vitals and nursing note reviewed.  Constitutional:      Appearance: She is not ill-appearing or toxic-appearing.  HENT:     Head: Normocephalic.     Mouth/Throat:     Mouth: Mucous membranes are moist.  Eyes:      Conjunctiva/sclera: Conjunctivae normal.  Cardiovascular:     Rate and Rhythm: Normal rate and regular rhythm.     Pulses: Normal pulses.     Heart sounds: Normal heart sounds.  Pulmonary:     Effort: Pulmonary effort is normal.  Abdominal:     General: There is no distension.  Musculoskeletal:     Cervical back: Normal range  of motion.  Skin:    General: Skin is warm and dry.     Findings: Rash present.     Comments: Please see media below.  Maculopapular rash localized to face. No lesions seen on eyelids.   Neurological:     Mental Status: She is alert.        LABORATORY DATA I have reviewed the data as listed    Latest Ref Rng & Units 03/10/2024    1:09 PM 02/18/2024   11:50 AM 01/23/2024   12:41 PM  CBC  WBC 4.0 - 10.5 K/uL 7.4  7.1  4.7   Hemoglobin 12.0 - 15.0 g/dL 87.5  87.2  85.9   Hematocrit 36.0 - 46.0 % 38.2  37.6  43.1   Platelets 150 - 400 K/uL 268  376  202         Latest Ref Rng & Units 03/10/2024    1:09 PM 02/18/2024   11:50 AM 01/23/2024   12:41 PM  CMP  Glucose 70 - 99 mg/dL 97  892  878   BUN 8 - 23 mg/dL 13  13  13    Creatinine 0.44 - 1.00 mg/dL 9.20  9.25  9.16   Sodium 135 - 145 mmol/L 140  139  141   Potassium 3.5 - 5.1 mmol/L 4.0  3.9  3.9   Chloride 98 - 111 mmol/L 106  107  106   CO2 22 - 32 mmol/L 27  27  31    Calcium 8.9 - 10.3 mg/dL 9.7  9.7  9.1   Total Protein 6.5 - 8.1 g/dL 7.3  6.8  6.8   Total Bilirubin 0.0 - 1.2 mg/dL 0.6  0.5  0.5   Alkaline Phos 38 - 126 U/L 64  64  67   AST 15 - 41 U/L 14  11  13    ALT 0 - 44 U/L 11  10  10         RADIOGRAPHIC STUDIES (from last 24 hours if applicable) I have personally reviewed the radiological images as listed and agreed with the findings in the report. No results found.      Visit Diagnosis: 1. Malignant neoplasm of overlapping sites of left breast in female, estrogen receptor positive (HCC)   2. Rash      No orders of the defined types were placed in this  encounter.   All questions were answered. The patient knows to call the clinic with any problems, questions or concerns. No barriers to learning was detected.  A total of more than 30 minutes were spent on this encounter with face-to-face time and non-face-to-face time, including preparing to see the patient, ordering tests and/or medications, counseling the patient and coordination of care as outlined above.    Thank you for allowing me to participate in the care of this patient.    Mehak Roskelley E  Walisiewicz, PA-C Department of Hematology/Oncology [facility] Phone: [phone]  [phone]    03/24/2024 4:14 PM

## 2024-03-25 ENCOUNTER — Encounter: Payer: Self-pay | Admitting: Physician Assistant

## 2024-04-01 ENCOUNTER — Inpatient Hospital Stay

## 2024-04-01 ENCOUNTER — Inpatient Hospital Stay: Admitting: Hematology and Oncology

## 2024-04-01 ENCOUNTER — Encounter: Payer: Self-pay | Admitting: Hematology and Oncology

## 2024-04-01 VITALS — BP 136/66 | HR 79 | Temp 98.7°F | Resp 16 | Wt 245.1 lb

## 2024-04-01 DIAGNOSIS — Z170 Estrogen receptor positive status [ER+]: Secondary | ICD-10-CM | POA: Diagnosis not present

## 2024-04-01 DIAGNOSIS — C50812 Malignant neoplasm of overlapping sites of left female breast: Secondary | ICD-10-CM | POA: Diagnosis not present

## 2024-04-01 LAB — CMP (CANCER CENTER ONLY)
ALT: 8 U/L (ref 0–44)
AST: 18 U/L (ref 15–41)
Albumin: 4.1 g/dL (ref 3.5–5.0)
Alkaline Phosphatase: 65 U/L (ref 38–126)
Anion gap: 11 (ref 5–15)
BUN: 12 mg/dL (ref 8–23)
CO2: 24 mmol/L (ref 22–32)
Calcium: 9.8 mg/dL (ref 8.9–10.3)
Chloride: 106 mmol/L (ref 98–111)
Creatinine: 0.77 mg/dL (ref 0.44–1.00)
GFR, Estimated: >60 mL/min (ref >60)
Glucose, Bld: 135 mg/dL — ABNORMAL HIGH (ref 70–99)
Potassium: 4.1 mmol/L (ref 3.5–5.1)
Sodium: 141 mmol/L (ref 135–145)
Total Bilirubin: 0.4 mg/dL (ref 0.0–1.2)
Total Protein: 6.7 g/dL (ref 6.5–8.1)

## 2024-04-01 LAB — CBC WITH DIFFERENTIAL (CANCER CENTER ONLY)
Abs Immature Granulocytes: 0.06 K/uL (ref 0.00–0.07)
Basophils Absolute: 0 K/uL (ref 0.0–0.1)
Basophils Relative: 0 %
Eosinophils Absolute: 0 K/uL (ref 0.0–0.5)
Eosinophils Relative: 0 %
HCT: 33.9 % — ABNORMAL LOW (ref 36.0–46.0)
Hemoglobin: 11 g/dL — ABNORMAL LOW (ref 12.0–15.0)
Immature Granulocytes: 1 %
Lymphocytes Relative: 20 %
Lymphs Abs: 0.9 K/uL (ref 0.7–4.0)
MCH: 29.3 pg (ref 26.0–34.0)
MCHC: 32.4 g/dL (ref 30.0–36.0)
MCV: 90.4 fL (ref 80.0–100.0)
Monocytes Absolute: 0.7 K/uL (ref 0.1–1.0)
Monocytes Relative: 16 %
Neutro Abs: 3 K/uL (ref 1.7–7.7)
Neutrophils Relative %: 63 %
Platelet Count: 276 K/uL (ref 150–400)
RBC: 3.75 MIL/uL — ABNORMAL LOW (ref 3.87–5.11)
RDW: 17.2 % — ABNORMAL HIGH (ref 11.5–15.5)
WBC Count: 4.8 K/uL (ref 4.0–10.5)
nRBC: 0 % (ref 0.0–0.2)

## 2024-04-01 MED ORDER — DEXAMETHASONE SOD PHOSPHATE PF 10 MG/ML IJ SOLN
10.0000 mg | Freq: Once | INTRAMUSCULAR | Status: AC
  Administered 2024-04-01: 10 mg via INTRAVENOUS

## 2024-04-01 MED ORDER — SODIUM CHLORIDE 0.9 % IV SOLN: INTRAVENOUS | Status: DC

## 2024-04-01 MED ORDER — SODIUM CHLORIDE 0.9 % IV SOLN
600.0000 mg/m2 | Freq: Once | INTRAVENOUS | Status: AC
  Administered 2024-04-01: 1500 mg via INTRAVENOUS
  Filled 2024-04-01: qty 75

## 2024-04-01 MED ORDER — SODIUM CHLORIDE 0.9 % IV SOLN
75.0000 mg/m2 | Freq: Once | INTRAVENOUS | Status: AC
  Administered 2024-04-01: 160 mg via INTRAVENOUS
  Filled 2024-04-01: qty 16

## 2024-04-01 MED ORDER — CLINDAMYCIN PHOS (ONCE-DAILY) 1 % EX GEL: 1.0000 | Freq: Every day | CUTANEOUS | 0 refills | Status: AC

## 2024-04-01 MED ORDER — PALONOSETRON HCL INJECTION 0.25 MG/5ML
0.2500 mg | Freq: Once | INTRAVENOUS | Status: AC
  Administered 2024-04-01: 0.25 mg via INTRAVENOUS
  Filled 2024-04-01: qty 5

## 2024-04-01 NOTE — Patient Instructions (Signed)
 [facility] - A DEPT OF [facility]  Discharge Instructions: Thank you for choosing [facility] to provide your oncology and hematology care.   If you have a lab appointment with the Cancer Center, please go directly to the Cancer Center and check in at the registration area.   Wear comfortable clothing and clothing appropriate for easy access to any Portacath or PICC line.   We strive to give you quality time with your provider. You may need to reschedule your appointment if you arrive late (15 or more minutes).  Arriving late affects you and other patients whose appointments are after yours.  Also, if you miss three or more appointments without notifying the office, you may be dismissed from the clinic at the provider's discretion.      For prescription refill requests, have your pharmacy contact our office and allow 72 hours for refills to be completed.    Today you received the following chemotherapy and/or immunotherapy agents: Docetaxel, Cyclophosphamide      To help prevent nausea and vomiting after your treatment, we encourage you to take your nausea medication as directed.  BELOW ARE SYMPTOMS THAT SHOULD BE REPORTED IMMEDIATELY: *FEVER GREATER THAN 100.4 F (38 C) OR HIGHER *CHILLS OR SWEATING *NAUSEA AND VOMITING THAT IS NOT CONTROLLED WITH YOUR NAUSEA MEDICATION *UNUSUAL SHORTNESS OF BREATH *UNUSUAL BRUISING OR BLEEDING *URINARY PROBLEMS (pain or burning when urinating, or frequent urination) *BOWEL PROBLEMS (unusual diarrhea, constipation, pain near the anus) TENDERNESS IN MOUTH AND THROAT WITH OR WITHOUT PRESENCE OF ULCERS (sore throat, sores in mouth, or a toothache) UNUSUAL RASH, SWELLING OR PAIN  UNUSUAL VAGINAL DISCHARGE OR ITCHING   Items with * indicate a potential emergency and should be followed up as soon as possible or go to the Emergency Department if any problems should occur.  Please show the CHEMOTHERAPY ALERT CARD  or IMMUNOTHERAPY ALERT CARD at check-in to the Emergency Department and triage nurse.  Should you have questions after your visit or need to cancel or reschedule your appointment, please contact [facility] - A DEPT OF [facility]  Dept: [phone]  and follow the prompts.  Office hours are 8:00 a.m. to 4:30 p.m. Monday - Friday. Please note that voicemails left after 4:00 p.m. may not be returned until the following business day.  We are closed weekends and major holidays. You have access to a nurse at all times for urgent questions. Please call the main number to the clinic Dept: [phone] and follow the prompts.   For any non-urgent questions, you may also contact your provider using MyChart. We now offer e-Visits for anyone 15 and older to request care online for non-urgent symptoms. For details visit [email or web address].   Also download the MyChart app! Go to the app store, search "MyChart", open the app, select [facility], and log in with your MyChart username and password.

## 2024-04-01 NOTE — Progress Notes (Signed)
 [facility] Cancer Follow up:    Bonnie Collins ORN, PA-C [address] [place] [address]   DIAGNOSIS:  Cancer Staging  Malignant neoplasm of overlapping sites of left breast in female, estrogen receptor positive (HCC) Staging form: Breast, AJCC 8th Edition - Pathologic: Stage IB (pT3, pN2a(sn), cM0, G2, ER+, PR+, HER2-) - Signed by Loretha Ash, MD on 12/24/2023 Method of lymph node assessment: Sentinel lymph node biopsy Multigene prognostic tests performed: None Histologic grading system: 3 grade system    SUMMARY OF ONCOLOGIC HISTORY: Oncology History  Malignant neoplasm of overlapping sites of left breast in female, estrogen receptor positive (HCC)  11/13/2023 Mammogram   Mammogram with asymmetric increase in fibroglandular tissue in left breast which is indeterminate.  No sonographic correlate or abnormality to account for the asymmetric increase in the glandular tissue in the left breast.  Given this can be a subtle presentation of invasive lobular carcinoma contrast-enhanced mammogram is recommended  Contrast-enhanced imaging was successful and without incident this showed scattered non-mass enhancement throughout the left breast, anterior to middle depth.   11/21/2023 Pathology Results   Left breast needle core biopsy at 12:00 4 cm from the nipple showed overall grade 2 invasive mammary carcinoma. Left breast needle core biopsy retroareolar at 4:00 showed overall grade 2 invasive mammary carcinoma prognostic showed ER 95% positive strong staining intensity PR 50% positive staining intensity HER2 0 and Ki-67 of 20%   11/27/2023 Initial Diagnosis   Malignant neoplasm of overlapping sites of left breast in female, estrogen receptor positive (HCC)    Genetic Testing   [facility] CancerNext Panel+RNA was Negative. Report date is 12/09/2023.  The [facility] CancerNext+RNAinsight Panel includes sequencing, rearrangement analysis, and RNA analysis for the following 40 genes:  APC, ATM, BAP1, BARD1, BMPR1A, BRCA1, BRCA2, BRIP1, CDH1, CDKN2A, CHEK2, FH, FLCN, MET, MLH1, MSH2, MSH6, MUTYH, NF1, NTHL1, PALB2, PMS2, PTEN, RAD51C, RAD51D, RPS20, SMAD4, STK11, TP53, TSC1, TSC2, and VHL (sequencing and deletion/duplication); AXIN2, HOXB13, MBD4, MSH3, POLD1 and POLE (sequencing only); EPCAM and GREM1 (deletion/duplication only).    11/28/2023 Cancer Staging   Staging form: Breast, AJCC 8th Edition - Pathologic: Stage IB (pT3, pN2a(sn), cM0, G2, ER+, PR+, HER2-) - Signed by Loretha Ash, MD on 12/24/2023 Method of lymph node assessment: Sentinel lymph node biopsy Multigene prognostic tests performed: None Histologic grading system: 3 grade system   01/25/2024 -  Chemotherapy   Patient is on Treatment Plan : BREAST TC q21d       CURRENT THERAPY: taxotere /cytoxan   INTERVAL HISTORY:  Discussed the use of AI scribe software for clinical note transcription with the patient, who gave verbal consent to proceed.  History of Present Illness Bonnie Collins is a 73 year old female undergoing chemotherapy who presents for follow-up and evaluation prior to cycle 4 of TC  She developed a facial rash after her third cancer treatment. The rash is red, primarily located on her face, and not associated with itching. She has been using one percent hydrocortisone  cream, which was effective after her second treatment, but the rash has not completely resolved after the third treatment. The rash becomes more pronounced after consuming coffee or when she becomes overheated.  Following her third treatment, she experienced a reaction to her injections, including one night of fever, chills, and aches, which resolved by the next morning. She also reports nasal congestion that worsens with each treatment but has recently improved over the last few days.  She visited an eye doctor due to redness  and watering of her eyes after the second and third treatments. Steroid drops were prescribed, which  alleviated the symptoms.  No bone pain since the first treatment. No current fevers, chills, diarrhea, or burning pain during urination. She manages constipation following treatments and has been doing well in that regard. No rash inside her mouth, ulcers, diarrhea, or burning pain during urination.    Patient Active Problem List   Diagnosis Date Noted   Genetic testing 12/10/2023   Malignant neoplasm of overlapping sites of left breast in female, estrogen receptor positive (HCC) 11/27/2023   Well woman exam with routine gynecological exam 09/06/2023   Mixed incontinence 09/06/2023   Arthritis of right knee 11/24/2020   Benign neoplasm of skin of face 11/24/2020   Dermatitis 11/24/2020   Primary osteoarthritis of first carpometacarpal joint of right hand 04/07/2020   Rupture of extensor tendon of right hand 04/07/2020   Scapholunate dissociation, right 04/07/2020   Chronic pain of right knee 12/10/2019   Pure hypercholesterolemia 05/14/2018   Hallux valgus 11/25/2015   Reactive airway disease 10/18/2015   Obesity 10/18/2015   Inflamed seborrheic keratosis 10/18/2015   History of breast cancer 10/18/2015   DCIS (ductal carcinoma in situ)     has no known allergies.  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; s/p lumpectomy; s/p adj XRT; on adj Tamoxifen    Hypertension    Sinus problem     SURGICAL HISTORY: Past Surgical History:  Procedure Laterality Date   BREAST LUMPECTOMY Left 05/09/2007   CARPAL TUNNEL RELEASE Bilateral 1993 & 2003   MASTECTOMY W/ SENTINEL NODE BIOPSY Left 12/13/2023   Procedure: MASTECTOMY WITH SENTINEL LYMPH NODE BIOPSY;  Surgeon: Vernetta Berg, MD;  Location: [facility];  Service: General;  Laterality: Left;  LEFT BREAST MASTECTOMY AND SENTINEL NODE BIOPSY   TONSILLECTOMY  age 36     SOCIAL HISTORY: Social History    Socioeconomic History   Marital status: Single    Spouse name: Not on file   Number of children: 0   Years of education: Not on file   Highest education level: Not on file  Occupational History   Not on file  Tobacco Use   Smoking status: Never   Smokeless tobacco: Never  Vaping Use   Vaping status: Never Used  Substance and Sexual Activity   Alcohol use: Yes    Alcohol/week: 0.0 - 1.0 standard drinks of alcohol    Comment: occasionally   Drug use: No   Sexual activity: Not Currently    Partners: Male    Birth control/protection: Post-menopausal  Other Topics Concern   Not on file  Social History Narrative   Not on file   Social Drivers of Health   Financial Resource Strain: Not on file  Food Insecurity: Low Risk  (01/31/2024)   Received from [facility]   Hunger Vital Sign    Within the past 12 months, you worried that your food would run out before you got money to buy more: Never true    Within the past 12 months, the food you bought just didn't last and you didn't have money to get more. : Never true  Transportation Needs: No Transportation Needs (01/31/2024)   Received from [facility]    In the past 12 months, has lack of reliable transportation kept you from medical  appointments, meetings, work or from getting things needed for daily living? : No  Physical Activity: Not on file  Stress: Not on file  Social Connections: Not on file  Intimate Partner Violence: Not At Risk (11/28/2023)   Humiliation, Afraid, Rape, and Kick questionnaire    Fear of Current or Ex-Partner: No    Emotionally Abused: No    Physically Abused: No    Sexually Abused: No    FAMILY HISTORY: Family History  Problem Relation Age of Onset   Diabetes Mother    Heart disease Mother    Heart disease Father    Heart failure Father    Prostate cancer Brother 57   Lung cancer Paternal Aunt 49       smoked   Multiple myeloma Paternal Uncle    Prostate cancer Maternal  Cousin    Colon cancer Neg Hx     Review of Systems  Constitutional:  Negative for appetite change, chills, fatigue, fever and unexpected weight change.  HENT:   Negative for hearing loss, lump/mass and trouble swallowing.   Eyes:  Negative for eye problems and icterus.  Respiratory:  Negative for chest tightness, cough and shortness of breath.   Cardiovascular:  Negative for chest pain, leg swelling and palpitations.  Gastrointestinal:  Negative for abdominal distention, abdominal pain, constipation, diarrhea, nausea and vomiting.  Endocrine: Negative for hot flashes.  Genitourinary:  Negative for difficulty urinating.   Musculoskeletal:  Negative for arthralgias.  Skin:  Negative for itching and rash.  Neurological:  Negative for dizziness, extremity weakness, headaches and numbness.  Hematological:  Negative for adenopathy. Does not bruise/bleed easily.  Psychiatric/Behavioral:  Negative for depression. The patient is not nervous/anxious.       PHYSICAL EXAMINATION    Vitals:   04/01/24 0918  BP: 136/66  Pulse: 79  Resp: 16  Temp: 98.7 F (37.1 C)  SpO2: 97%    Physical Exam Constitutional:      General: She is not in acute distress.    Appearance: Normal appearance. She is not toxic-appearing.  HENT:     Head: Normocephalic and atraumatic.     Mouth/Throat:     Mouth: Mucous membranes are moist.     Pharynx: Oropharynx is clear. No oropharyngeal exudate or posterior oropharyngeal erythema.  Eyes:     General: No scleral icterus. Cardiovascular:     Rate and Rhythm: Normal rate and regular rhythm.     Pulses: Normal pulses.     Heart sounds: Normal heart sounds.  Pulmonary:     Effort: Pulmonary effort is normal.     Breath sounds: Normal breath sounds.  Abdominal:     General: Abdomen is flat. Bowel sounds are normal. There is no distension.     Palpations: Abdomen is soft.     Tenderness: There is no abdominal tenderness.  Musculoskeletal:         General: No swelling.     Cervical back: Neck supple.  Lymphadenopathy:     Cervical: No cervical adenopathy.  Skin:    General: Skin is warm and dry.     Findings: Rash (macular rash on the face.) present.  Neurological:     General: No focal deficit present.     Mental Status: She is alert.  Psychiatric:        Mood and Affect: Mood normal.        Behavior: Behavior normal.     LABORATORY DATA:  CBC    Component Value Date/Time  WBC 4.8 04/01/2024 0901   WBC 4.3 11/20/2013 1251   WBC 5.5 12/05/2007 1604   RBC 3.75 (L) 04/01/2024 0901   HGB 11.0 (L) 04/01/2024 0901   HGB 14.7 01/12/2015 1057   HGB 13.6 11/20/2013 1251   HCT 33.9 (L) 04/01/2024 0901   HCT 42.2 11/20/2013 1251   PLT 276 04/01/2024 0901   PLT 201 11/20/2013 1251   MCV 90.4 04/01/2024 0901   MCV 87.9 11/20/2013 1251   MCH 29.3 04/01/2024 0901   MCHC 32.4 04/01/2024 0901   RDW 17.2 (H) 04/01/2024 0901   RDW 14.6 (H) 11/20/2013 1251   LYMPHSABS 0.9 04/01/2024 0901   LYMPHSABS 1.3 11/20/2013 1251   MONOABS 0.7 04/01/2024 0901   MONOABS 0.3 11/20/2013 1251   EOSABS 0.0 04/01/2024 0901   EOSABS 0.1 11/20/2013 1251   BASOSABS 0.0 04/01/2024 0901   BASOSABS 0.0 11/20/2013 1251    CMP     Component Value Date/Time   NA 140 03/10/2024 1309   NA 144 11/20/2013 1251   K 4.0 03/10/2024 1309   K 4.0 11/20/2013 1251   CL 106 03/10/2024 1309   CL 107 03/20/2012 1257   CO2 27 03/10/2024 1309   CO2 28 11/20/2013 1251   GLUCOSE 97 03/10/2024 1309   GLUCOSE 141 (H) 11/20/2013 1251   GLUCOSE 92 03/20/2012 1257   BUN 13 03/10/2024 1309   BUN 13.3 11/20/2013 1251   CREATININE 0.79 03/10/2024 1309   CREATININE 0.9 11/20/2013 1251   CALCIUM 9.7 03/10/2024 1309   CALCIUM 9.6 11/20/2013 1251   PROT 7.3 03/10/2024 1309   PROT 6.8 11/20/2013 1251   ALBUMIN 4.3 03/10/2024 1309   ALBUMIN 3.6 11/20/2013 1251   AST 14 (L) 03/10/2024 1309   AST 16 11/20/2013 1251   ALT 11 03/10/2024 1309   ALT 13 11/20/2013  1251   ALKPHOS 64 03/10/2024 1309   ALKPHOS 72 11/20/2013 1251   BILITOT 0.6 03/10/2024 1309   BILITOT 0.35 11/20/2013 1251   GFRNONAA >60 03/10/2024 1309   GFRAA  12/05/2007 1604    >60        The eGFR has been calculated using the MDRD equation. This calculation has not been validated in all clinical     ASSESSMENT and THERAPY PLAN:   Assessment and Plan Assessment & Plan Malignant neoplasm of left breast on active chemotherapy She is here before planned C4 of TC No major concerns today except for rash ROS and PE, skin rash noted No findings on exam otherwise Ok to proceed with treatment as planned.  Facial rash and rosacea exacerbation due to chemotherapy (taxane) Facial rash and rosacea exacerbation likely due to taxane chemotherapy, presenting as redness on the face without itching. Rash is exacerbated by sun exposure, indicating a phototoxic reaction. - Prescribed Clindagel for facial rash. - Advised use of sunscreen and wearing a white brimmed hat to protect face from sun exposure. - Continue using 1% hydrocortisone  ointment as needed.  Epiphora (excessive tearing) and nasal congestion due to chemotherapy Epiphora and nasal congestion likely due to taxane chemotherapy. Symptoms have improved since the last treatment. - Continue to monitor symptoms and report any worsening.   RTC in 3 weeks for labs, f/u, and next treatment.   All questions were answered. The patient knows to call the clinic with any problems, questions or concerns. We can certainly see the patient much sooner if necessary.  Total encounter time:30 minutes*in face-to-face visit time, chart review, lab review, care coordination, order entry,  and documentation of the encounter time.   *Total Encounter Time as defined by the Centers for Medicare and Medicaid Services includes, in addition to the face-to-face time of a patient visit (documented in the note above) non-face-to-face time: obtaining and  reviewing outside history, ordering and reviewing medications, tests or procedures, care coordination (communications with other health care professionals or caregivers) and documentation in the medical record.

## 2024-04-04 ENCOUNTER — Inpatient Hospital Stay

## 2024-04-04 ENCOUNTER — Encounter: Payer: Self-pay | Admitting: Hematology and Oncology

## 2024-04-04 ENCOUNTER — Encounter: Payer: Self-pay | Admitting: *Deleted

## 2024-04-04 VITALS — BP 146/80 | HR 66 | Temp 98.6°F | Resp 16

## 2024-04-04 DIAGNOSIS — C50812 Malignant neoplasm of overlapping sites of left female breast: Secondary | ICD-10-CM | POA: Diagnosis not present

## 2024-04-04 MED ORDER — PEGFILGRASTIM-JMDB 6 MG/0.6ML ~~LOC~~ SOSY
6.0000 mg | PREFILLED_SYRINGE | Freq: Once | SUBCUTANEOUS | Status: AC
  Administered 2024-04-04: 6 mg via SUBCUTANEOUS
  Filled 2024-04-04: qty 0.6

## 2024-04-23 NOTE — Progress Notes (Signed)
 Location of Breast Cancer:left \  Histology per Pathology Report:    Receptor Status: ER(95), PR (50), Her2-neu (), Ki-67(20)  Did patient present with symptoms (if so, please note symptoms) or was this found on screening mammography?: mammogram  Past/Anticipated interventions by surgeon, if any: MASTECTOMY WITH SENTINEL LYMPH NODE BIOPSY (Left: Breast)   Past/Anticipated interventions by medical oncology, if any: Chemotherapy taxotere /cytoxan    Lymphedema issues, if any: No     Pain issues, if any: None    SAFETY ISSUES: Prior radiation? Left Breast 2009, Dr. Shannon Pacemaker/ICD? No Possible current pregnancy? Postmenopausal Is the patient on methotrexate? No  Current Complaints / other details:

## 2024-04-23 NOTE — Progress Notes (Signed)
 Radiation Oncology         [phone] ________________________________  Name: Bonnie Collins MRN: [id number]  Date: 04/24/2024  DOB: 09-15-1950  Re-Evaluation Note  CC: Damariz Paganelli, Natalie W, PA-C  Iruku, Praveena, MD  No diagnosis found.  Diagnosis:  The encounter diagnosis was Malignant neoplasm of overlapping sites of left breast in female, estrogen receptor positive (HCC).   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 She was seen in the multidisciplinary breast clinic on 11/28/23.  Since consultation, she underwent genetic testing on 12/09/23. Results showed no pathogenic mutation contributing to her recent diagnosis.  She has been treated with Taxotere  under Dr. Loretha. Treatment was initiated on 01/25/24. Following her third cycle on 11/3, patient developed a red rash for which neosporin was given to relief it.   She presented for a follow up with PA Walisiewicz on 03/24/24, where she reported the rash not improving despite using neosporin and noticing itching on her right eyelid, which she has not treated due to its proximity to her eye.  She returned for a follow up with Dr. Loretha on 04/01/24 where she reported worsening of her facial rash near her eyes despite using hydrocortisone . As a result of rash, she visited an opthalmologist who prescribed steroids eye drops that alleviated symptoms. At that time, Dr. Loretha prescribed Clindagel for facial rash.   On review of systems, the patient reports ***. She denies *** and any other symptoms.    Allergies:  has no known allergies.  Meds: Current Outpatient  Medications  Medication Sig Dispense Refill   cholecalciferol (VITAMIN D3) 25 MCG (1000 UNIT) tablet Take 1,000 Units by mouth.     Clindamycin  Phos, Once-Daily, (CLINDAGEL) 1 % GEL Apply 1 Application topically daily at 6 (six) AM. 75 mL 0   dexamethasone  (DECADRON ) 4 MG tablet Take 2 tabs by mouth 2 times daily starting day before chemo. Then take 2 tabs daily for 2 days starting day after chemo. Take with food. 30 tablet 1   hydrocortisone  1 % lotion Apply 1 Application topically 2 (two) times daily. Apply thin layer only for 5 days. Do not apply to eye lids. 118 mL 0   losartan  (COZAAR ) 50 MG tablet Take 1 tablet by mouth daily.     Multiple Vitamin (MULTIVITAMIN PO) Take 1 capsule by mouth.      ondansetron  (ZOFRAN ) 8 MG tablet Take 1 tablet (8 mg total) by mouth every 8 (eight) hours as needed for nausea or vomiting. Start on the third day after chemotherapy. 30 tablet 1   prochlorperazine  (COMPAZINE ) 10 MG tablet Take 1 tablet (10 mg total) by mouth every 6 (six) hours as needed for nausea or vomiting. 30 tablet 1   rosuvastatin (CRESTOR) 10 MG tablet Take 10 mg by mouth.     No current facility-administered medications for this visit.    Physical Findings: The patient is in no acute distress. Patient is alert and oriented.  vitals were not taken for this visit.  No significant changes. Lungs are clear to auscultation bilaterally. Heart has regular rate and rhythm. No palpable cervical, supraclavicular, or axillary adenopathy. Abdomen soft,  non-tender, normal bowel sounds. *** Breast: no palpable mass, nipple discharge or bleeding. *** Breast: ***  Lab Findings: Lab Results  Component Value Date   WBC 4.8 04/01/2024   HGB 11.0 (L) 04/01/2024   HCT 33.9 (L) 04/01/2024   MCV 90.4 04/01/2024   PLT 276 04/01/2024    Radiographic Findings: No results found.  Impression: Stage IB Overlapping sites of the left breast, Invasive Lobular Carcinoma, ER+ / PR+ / Her2-, Grade  2  ***  Plan:  Patient is scheduled for CT simulation {date/later today}. ***  -----------------------------------  Lynwood CHARM Nasuti, PhD, MD   This document serves as a record of services personally performed by Lynwood Nasuti, MD. It was created on his behalf by Reymundo Cartwright, a trained medical scribe. The creation of this record is based on the scribe's personal observations and the provider's statements to them. This document has been checked and approved by the attending provider.

## 2024-04-24 ENCOUNTER — Ambulatory Visit
Admission: RE | Admit: 2024-04-24 | Discharge: 2024-04-24 | Attending: Radiation Oncology | Admitting: Radiation Oncology

## 2024-04-24 ENCOUNTER — Ambulatory Visit
Admission: RE | Admit: 2024-04-24 | Discharge: 2024-04-24 | Disposition: A | Discharge status: Home / Self Care | Source: Ambulatory Visit | Attending: Radiation Oncology | Admitting: Radiation Oncology

## 2024-04-24 VITALS — BP 161/92 | HR 73 | Temp 97.3°F | Resp 18 | Ht 66.0 in | Wt 239.0 lb

## 2024-04-24 DIAGNOSIS — C50812 Malignant neoplasm of overlapping sites of left female breast: Secondary | ICD-10-CM | POA: Insufficient documentation

## 2024-04-24 DIAGNOSIS — Z79899 Other long term (current) drug therapy: Secondary | ICD-10-CM | POA: Diagnosis not present

## 2024-04-24 DIAGNOSIS — Z170 Estrogen receptor positive status [ER+]: Secondary | ICD-10-CM | POA: Diagnosis not present

## 2024-04-24 DIAGNOSIS — Z1721 Progesterone receptor positive status: Secondary | ICD-10-CM | POA: Diagnosis not present

## 2024-04-24 DIAGNOSIS — Z7952 Long term (current) use of systemic steroids: Secondary | ICD-10-CM | POA: Insufficient documentation

## 2024-04-24 DIAGNOSIS — Z1732 Human epidermal growth factor receptor 2 negative status: Secondary | ICD-10-CM | POA: Insufficient documentation

## 2024-04-28 ENCOUNTER — Ambulatory Visit
Admission: RE | Admit: 2024-04-28 | Discharge: 2024-04-28 | Disposition: A | Discharge status: Home / Self Care | Source: Ambulatory Visit | Attending: Radiation Oncology | Admitting: Radiation Oncology

## 2024-04-28 DIAGNOSIS — C50812 Malignant neoplasm of overlapping sites of left female breast: Secondary | ICD-10-CM | POA: Diagnosis present

## 2024-05-12 ENCOUNTER — Ambulatory Visit
Admission: RE | Admit: 2024-05-12 | Discharge: 2024-05-12 | Disposition: A | Discharge status: Home / Self Care | Source: Ambulatory Visit | Attending: Radiation Oncology | Admitting: Radiation Oncology

## 2024-05-12 DIAGNOSIS — D0512 Intraductal carcinoma in situ of left breast: Secondary | ICD-10-CM | POA: Insufficient documentation

## 2024-05-12 DIAGNOSIS — C50812 Malignant neoplasm of overlapping sites of left female breast: Secondary | ICD-10-CM | POA: Insufficient documentation

## 2024-05-12 DIAGNOSIS — Z170 Estrogen receptor positive status [ER+]: Secondary | ICD-10-CM | POA: Insufficient documentation

## 2024-05-13 NOTE — Progress Notes (Signed)
 Reason for Visit: Bonnie Collins is here after her final chemotherapy treatment to check her hearing. Results: Refer to results scanned into Media tab.  Otoscopy:  some non-occluding cerumen bilaterally.   Audiometric Results: Results were obtained using headphones. Reliability was good. 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  There has been no change since her last audiogram.    Speech Reception Thresholds: Completed using monitored live voice. Right Ear: 20 dBHL Left Ear: 20 dBHL  Word Recognition Testing: Completed using recording with [id number] List. Right Ear: 100 % at 65 dBHL with 35 dBem Left Ear: 100 % at 65 dBHL with 35 dBem   Recommendations: Recommend repeat test in one year, sooner with concerns of sudden hearing loss, deteriorating hearing, or new or worsening tinnitus. Continue hearing aid use.

## 2024-05-14 ENCOUNTER — Other Ambulatory Visit: Payer: Self-pay

## 2024-05-14 ENCOUNTER — Telehealth: Payer: Self-pay

## 2024-05-14 DIAGNOSIS — Z170 Estrogen receptor positive status [ER+]: Secondary | ICD-10-CM

## 2024-05-14 LAB — RAD ONC ARIA SESSION SUMMARY

## 2024-05-14 NOTE — Telephone Encounter (Signed)
 Pt called and reports starting around Christmas, she noticed BLE edema which worsened throughout the day. She thought it was d/t being on her feet often throughout the holidays, but states it has persisted and that every day by the end of day her feet and ankles are swollen. She is concerned this is a side effect of treatment. She finished TC 04/01/24 and had her first xrt 05/13/24. She is requesting to be seen. Pt was offered [facility] visit with Morna Kendall, DNP for 05/15/24 at 1100 and accepted. She knows to arrive at 1045 for check in. Message routed to team for awareness.

## 2024-05-15 ENCOUNTER — Inpatient Hospital Stay: Attending: Internal Medicine

## 2024-05-15 ENCOUNTER — Encounter: Payer: Self-pay | Admitting: *Deleted

## 2024-05-15 ENCOUNTER — Other Ambulatory Visit: Payer: Self-pay

## 2024-05-15 ENCOUNTER — Ambulatory Visit
Admission: RE | Admit: 2024-05-15 | Discharge: 2024-05-15 | Disposition: A | Discharge status: Home / Self Care | Source: Ambulatory Visit | Attending: Radiation Oncology | Admitting: Radiation Oncology

## 2024-05-15 ENCOUNTER — Encounter: Payer: Self-pay | Admitting: Adult Health

## 2024-05-15 ENCOUNTER — Ambulatory Visit (HOSPITAL_BASED_OUTPATIENT_CLINIC_OR_DEPARTMENT_OTHER)
Admission: RE | Admit: 2024-05-15 | Discharge: 2024-05-15 | Disposition: A | Discharge status: Home / Self Care | Source: Ambulatory Visit | Attending: Adult Health | Admitting: Adult Health

## 2024-05-15 ENCOUNTER — Inpatient Hospital Stay: Attending: Internal Medicine | Admitting: Adult Health

## 2024-05-15 VITALS — BP 144/70 | HR 72 | Temp 97.3°F | Resp 17 | Wt 237.6 lb

## 2024-05-15 DIAGNOSIS — Z170 Estrogen receptor positive status [ER+]: Secondary | ICD-10-CM | POA: Insufficient documentation

## 2024-05-15 DIAGNOSIS — C50812 Malignant neoplasm of overlapping sites of left female breast: Secondary | ICD-10-CM | POA: Insufficient documentation

## 2024-05-15 DIAGNOSIS — Z9221 Personal history of antineoplastic chemotherapy: Secondary | ICD-10-CM | POA: Insufficient documentation

## 2024-05-15 DIAGNOSIS — M7989 Other specified soft tissue disorders: Secondary | ICD-10-CM

## 2024-05-15 DIAGNOSIS — Z8249 Family history of ischemic heart disease and other diseases of the circulatory system: Secondary | ICD-10-CM | POA: Insufficient documentation

## 2024-05-15 DIAGNOSIS — Z833 Family history of diabetes mellitus: Secondary | ICD-10-CM | POA: Insufficient documentation

## 2024-05-15 DIAGNOSIS — Z9012 Acquired absence of left breast and nipple: Secondary | ICD-10-CM | POA: Insufficient documentation

## 2024-05-15 DIAGNOSIS — Z807 Family history of other malignant neoplasms of lymphoid, hematopoietic and related tissues: Secondary | ICD-10-CM | POA: Insufficient documentation

## 2024-05-15 DIAGNOSIS — Z923 Personal history of irradiation: Secondary | ICD-10-CM | POA: Insufficient documentation

## 2024-05-15 DIAGNOSIS — Z86018 Personal history of other benign neoplasm: Secondary | ICD-10-CM | POA: Insufficient documentation

## 2024-05-15 DIAGNOSIS — Z8042 Family history of malignant neoplasm of prostate: Secondary | ICD-10-CM | POA: Insufficient documentation

## 2024-05-15 DIAGNOSIS — Z801 Family history of malignant neoplasm of trachea, bronchus and lung: Secondary | ICD-10-CM | POA: Insufficient documentation

## 2024-05-15 DIAGNOSIS — Z17411 Hormone receptor positive with human epidermal growth factor receptor 2 negative status: Secondary | ICD-10-CM | POA: Insufficient documentation

## 2024-05-15 DIAGNOSIS — R600 Localized edema: Secondary | ICD-10-CM | POA: Insufficient documentation

## 2024-05-15 DIAGNOSIS — E669 Obesity, unspecified: Secondary | ICD-10-CM | POA: Insufficient documentation

## 2024-05-15 DIAGNOSIS — I8390 Asymptomatic varicose veins of unspecified lower extremity: Secondary | ICD-10-CM | POA: Insufficient documentation

## 2024-05-15 DIAGNOSIS — M1811 Unilateral primary osteoarthritis of first carpometacarpal joint, right hand: Secondary | ICD-10-CM | POA: Insufficient documentation

## 2024-05-15 DIAGNOSIS — M19041 Primary osteoarthritis, right hand: Secondary | ICD-10-CM | POA: Insufficient documentation

## 2024-05-15 DIAGNOSIS — M1711 Unilateral primary osteoarthritis, right knee: Secondary | ICD-10-CM | POA: Insufficient documentation

## 2024-05-15 DIAGNOSIS — Z1721 Progesterone receptor positive status: Secondary | ICD-10-CM | POA: Insufficient documentation

## 2024-05-15 DIAGNOSIS — Z79899 Other long term (current) drug therapy: Secondary | ICD-10-CM | POA: Insufficient documentation

## 2024-05-15 LAB — RAD ONC ARIA SESSION SUMMARY
Course Elapsed Days: 1
Plan Fractions Treated to Date: 1
Plan Fractions Treated to Date: 2
Plan Prescribed Dose Per Fraction: 2 Gy
Plan Prescribed Dose Per Fraction: 2 Gy
Plan Total Fractions Prescribed: 12
Plan Total Fractions Prescribed: 25
Plan Total Prescribed Dose: 24 Gy
Plan Total Prescribed Dose: 50 Gy
Reference Point Dosage Given to Date: 2 Gy
Reference Point Dosage Given to Date: 4 Gy
Reference Point Session Dosage Given: 2 Gy
Reference Point Session Dosage Given: 2 Gy
Session Number: 2

## 2024-05-15 LAB — CMP (CANCER CENTER ONLY)
ALT: 14 U/L (ref 0–44)
AST: 20 U/L (ref 15–41)
Albumin: 4.1 g/dL (ref 3.5–5.0)
Alkaline Phosphatase: 70 U/L (ref 38–126)
Anion gap: 7 (ref 5–15)
BUN: 12 mg/dL (ref 8–23)
CO2: 29 mmol/L (ref 22–32)
Calcium: 9.6 mg/dL (ref 8.9–10.3)
Chloride: 105 mmol/L (ref 98–111)
Creatinine: 0.74 mg/dL (ref 0.44–1.00)
GFR, Estimated: >60 mL/min
Glucose, Bld: 93 mg/dL (ref 70–99)
Potassium: 4.7 mmol/L (ref 3.5–5.1)
Sodium: 141 mmol/L (ref 135–145)
Total Bilirubin: 0.5 mg/dL (ref 0.0–1.2)
Total Protein: 6.8 g/dL (ref 6.5–8.1)

## 2024-05-15 LAB — CBC WITH DIFFERENTIAL (CANCER CENTER ONLY)
Abs Immature Granulocytes: 0 K/uL (ref 0.00–0.07)
Basophils Absolute: 0 K/uL (ref 0.0–0.1)
Basophils Relative: 1 %
Eosinophils Absolute: 0.2 K/uL (ref 0.0–0.5)
Eosinophils Relative: 5 %
HCT: 39.2 % (ref 36.0–46.0)
Hemoglobin: 12.3 g/dL (ref 12.0–15.0)
Immature Granulocytes: 0 %
Lymphocytes Relative: 29 %
Lymphs Abs: 1.2 K/uL (ref 0.7–4.0)
MCH: 29.5 pg (ref 26.0–34.0)
MCHC: 31.4 g/dL (ref 30.0–36.0)
MCV: 94 fL (ref 80.0–100.0)
Monocytes Absolute: 0.4 K/uL (ref 0.1–1.0)
Monocytes Relative: 9 %
Neutro Abs: 2.2 K/uL (ref 1.7–7.7)
Neutrophils Relative %: 56 %
Platelet Count: 176 K/uL (ref 150–400)
RBC: 4.17 MIL/uL (ref 3.87–5.11)
RDW: 15.5 % (ref 11.5–15.5)
WBC Count: 4 K/uL (ref 4.0–10.5)
nRBC: 0 % (ref 0.0–0.2)

## 2024-05-15 MED ORDER — FUROSEMIDE 20 MG PO TABS: 20.0000 mg | ORAL_TABLET | Freq: Every day | ORAL | 0 refills | Status: AC | PRN

## 2024-05-15 NOTE — Progress Notes (Signed)
 [facility] Cancer Follow up:    Bonnie Collins ORN, PA-C [address] [place] [address]   DIAGNOSIS: Cancer Staging  Malignant neoplasm of overlapping sites of left breast in female, estrogen receptor positive (HCC) Staging form: Breast, AJCC 8th Edition - Pathologic: Stage IB (pT3, pN2a(sn), cM0, G2, ER+, PR+, HER2-) - Signed by Loretha Ash, MD on 12/24/2023 Method of lymph node assessment: Sentinel lymph node biopsy Multigene prognostic tests performed: None Histologic grading system: 3 grade system    SUMMARY OF ONCOLOGIC HISTORY: Oncology History  Malignant neoplasm of overlapping sites of left breast in female, estrogen receptor positive (HCC)  11/13/2023 Mammogram   Mammogram with asymmetric increase in fibroglandular tissue in left breast which is indeterminate.  No sonographic correlate or abnormality to account for the asymmetric increase in the glandular tissue in the left breast.  Given this can be a subtle presentation of invasive lobular carcinoma contrast-enhanced mammogram is recommended  Contrast-enhanced imaging was successful and without incident this showed scattered non-mass enhancement throughout the left breast, anterior to middle depth.   11/21/2023 Pathology Results   Left breast needle core biopsy at 12:00 4 cm from the nipple showed overall grade 2 invasive mammary carcinoma. Left breast needle core biopsy retroareolar at 4:00 showed overall grade 2 invasive mammary carcinoma prognostic showed ER 95% positive strong staining intensity PR 50% positive staining intensity HER2 0 and Ki-67 of 20%   11/27/2023 Initial Diagnosis   Malignant neoplasm of overlapping sites of left breast in female, estrogen receptor positive (HCC)    Genetic Testing   [facility] CancerNext Panel+RNA was Negative. Report date is 12/09/2023.  The [facility] CancerNext+RNAinsight Panel includes sequencing, rearrangement analysis, and RNA analysis for the following 40 genes:  APC, ATM, BAP1, BARD1, BMPR1A, BRCA1, BRCA2, BRIP1, CDH1, CDKN2A, CHEK2, FH, FLCN, MET, MLH1, MSH2, MSH6, MUTYH, NF1, NTHL1, PALB2, PMS2, PTEN, RAD51C, RAD51D, RPS20, SMAD4, STK11, TP53, TSC1, TSC2, and VHL (sequencing and deletion/duplication); AXIN2, HOXB13, MBD4, MSH3, POLD1 and POLE (sequencing only); EPCAM and GREM1 (deletion/duplication only).    11/28/2023 Cancer Staging   Staging form: Breast, AJCC 8th Edition - Pathologic: Stage IB (pT3, pN2a(sn), cM0, G2, ER+, PR+, HER2-) - Signed by Loretha Ash, MD on 12/24/2023 Method of lymph node assessment: Sentinel lymph node biopsy Multigene prognostic tests performed: None Histologic grading system: 3 grade system   01/25/2024 -  Chemotherapy   Patient is on Treatment Plan : BREAST TC q21d       CURRENT THERAPY:  INTERVAL HISTORY:  Discussed the use of AI scribe software for clinical note transcription with the patient, who gave verbal consent to proceed.  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  She completed four cycles of adjuvant Taxotere  and Cytoxan  on April 01, 2024, and began adjuvant radiation therapy this week. Since chemotherapy her physical activity has been markedly reduced compared with baseline.  She developed persistent bilateral lower extremity edema a few days before Christmas, described as puffy, with the right leg substantially larger than the left. Her right calf has always been larger, but she has not previously had swelling, pitting, or sock/compression marks. The edema partially improves with reduced sodium intake and increased hydration. She has lower extremity varicosities and denies prior right leg injury. She has not used diuretics and is intermittently  using compression stockings and leg elevation for symptom relief.     Patient Active Problem List   Diagnosis Date Noted   Genetic testing 12/10/2023   Malignant neoplasm of overlapping sites of left breast in female, estrogen receptor positive (HCC) 11/27/2023   Well woman exam with routine gynecological exam 09/06/2023   Mixed incontinence 09/06/2023   Arthritis of right knee 11/24/2020   Benign neoplasm of skin of face 11/24/2020   Dermatitis 11/24/2020   Primary osteoarthritis of first carpometacarpal joint of right hand 04/07/2020   Rupture of extensor tendon of right hand 04/07/2020   Scapholunate dissociation, right 04/07/2020   Chronic pain of right knee 12/10/2019   Pure hypercholesterolemia 05/14/2018   Hallux valgus 11/25/2015   Reactive airway disease 10/18/2015   Obesity 10/18/2015   Inflamed seborrheic keratosis 10/18/2015   History of breast cancer 10/18/2015   DCIS (ductal carcinoma in situ)     has no known allergies.  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; s/p lumpectomy; s/p adj XRT; on adj Tamoxifen    Hypertension    Sinus problem     SURGICAL HISTORY: Past Surgical History:  Procedure Laterality Date   BREAST LUMPECTOMY Left 05/09/2007   CARPAL TUNNEL RELEASE Bilateral 1993 & 2003   MASTECTOMY W/ SENTINEL NODE BIOPSY Left 12/13/2023   Procedure: MASTECTOMY WITH SENTINEL LYMPH NODE BIOPSY;  Surgeon: Vernetta Berg, MD;  Location: [facility];  Service: General;  Laterality: Left;  LEFT BREAST MASTECTOMY AND SENTINEL NODE BIOPSY   TONSILLECTOMY  age 27     SOCIAL HISTORY: Social History   Socioeconomic History   Marital status: Single    Spouse name: Not on file   Number of children: 0   Years of education: Not on file   Highest education level: Not on file  Occupational History   Not on file  Tobacco Use    Smoking status: Never   Smokeless tobacco: Never  Vaping Use   Vaping status: Never Used  Substance and Sexual Activity   Alcohol use: Yes    Alcohol/week: 0.0 - 1.0 standard drinks of alcohol    Comment: occasionally   Drug use: No   Sexual activity: Not Currently    Partners: Male    Birth control/protection: Post-menopausal  Other Topics Concern   Not on file  Social History Narrative   Not on file   Social Drivers of Health   Tobacco Use: Low Risk  (05/05/2024)   Received from [facility]   Patient History    Smoking Tobacco Use: Never    Smokeless Tobacco Use: Never    Passive Exposure: Not on file  Financial Resource Strain: Not on file  Food Insecurity: Low Risk (01/31/2024)   Received from [facility]   Epic    Within the past 12 months, you worried that your food would run out before you got money to buy more: Never true    Within the past 12 months, the food you bought just didn't last and you didn't have money to get more. : Never true  Transportation Needs: No Transportation Needs (01/31/2024)   Received from [facility]    In the past 12 months, has lack of reliable transportation kept you from medical appointments, meetings, work or from getting things needed for daily living? : No  Physical  Activity: Not on file  Stress: Not on file  Social Connections: Not on file  Intimate Partner Violence: Not At Risk (11/28/2023)   Epic    Fear of Current or Ex-Partner: No    Emotionally Abused: No    Physically Abused: No    Sexually Abused: No  Depression (PHQ2-9): Low Risk (03/24/2024)   Depression (PHQ2-9)    PHQ-2 Score: 0  Alcohol Screen: Not on file  Housing: Low Risk (01/31/2024)   Received from [facility]   Epic    What is your living situation today?: I have a steady place to live    Think about the place you live. Do you have problems with any of the following? Choose all that apply:: None/None on this list   Utilities: Low Risk (01/31/2024)   Received from [facility]   Utilities    In the past 12 months has the electric, gas, oil, or water company threatened to shut off services in your home? : No  Health Literacy: Not on file    FAMILY HISTORY: Family History  Problem Relation Age of Onset   Diabetes Mother    Heart disease Mother    Heart disease Father    Heart failure Father    Prostate cancer Brother 15   Lung cancer Paternal Aunt 76       smoked   Multiple myeloma Paternal Uncle    Prostate cancer Maternal Cousin    Colon cancer Neg Hx     Review of Systems  Constitutional:  Negative for appetite change, chills, fatigue, fever and unexpected weight change.  HENT:   Negative for hearing loss, lump/mass and trouble swallowing.   Eyes:  Negative for eye problems and icterus.  Respiratory:  Negative for chest tightness, cough and shortness of breath.   Cardiovascular:  Positive for leg swelling. Negative for chest pain and palpitations.  Gastrointestinal:  Negative for abdominal distention, abdominal pain, constipation, diarrhea, nausea and vomiting.  Endocrine: Negative for hot flashes.  Genitourinary:  Negative for difficulty urinating.   Musculoskeletal:  Negative for arthralgias.  Skin:  Negative for itching and rash.  Neurological:  Negative for dizziness, extremity weakness, headaches and numbness.  Hematological:  Negative for adenopathy. Does not bruise/bleed easily.  Psychiatric/Behavioral:  Negative for depression. The patient is not nervous/anxious.       PHYSICAL EXAMINATION    Vitals:   05/15/24 1058  BP: (!) 144/70  Pulse: 72  Resp: 17  Temp: (!) 97.3 F (36.3 C)  SpO2: 95%    Physical Exam Constitutional:      General: She is not in acute distress.    Appearance: Normal appearance. She is not toxic-appearing.  HENT:     Head: Normocephalic and atraumatic.     Mouth/Throat:     Mouth: Mucous membranes are moist.     Pharynx: Oropharynx is  clear. No oropharyngeal exudate or posterior oropharyngeal erythema.  Eyes:     General: No scleral icterus. Cardiovascular:     Rate and Rhythm: Normal rate and regular rhythm.     Pulses: Normal pulses.     Heart sounds: Normal heart sounds.  Pulmonary:     Effort: Pulmonary effort is normal.     Breath sounds: Normal breath sounds.  Abdominal:     General: Abdomen is flat. Bowel sounds are normal. There is no distension.     Palpations: Abdomen is soft.     Tenderness: There is no abdominal tenderness.  Musculoskeletal:  General: Swelling present.     Cervical back: Neck supple.     Right lower leg: Edema present.     Left lower leg: Edema present.     Comments: Right lower leg is noticeably larger than left, both ankles though have 1-2+ edema  Lymphadenopathy:     Cervical: No cervical adenopathy.  Skin:    General: Skin is warm and dry.     Findings: No rash.  Neurological:     General: No focal deficit present.     Mental Status: She is alert.  Psychiatric:        Mood and Affect: Mood normal.        Behavior: Behavior normal.     LABORATORY DATA:  CBC    Component Value Date/Time   WBC 4.8 04/01/2024 0901   WBC 4.3 11/20/2013 1251   WBC 5.5 12/05/2007 1604   RBC 3.75 (L) 04/01/2024 0901   HGB 11.0 (L) 04/01/2024 0901   HGB 14.7 01/12/2015 1057   HGB 13.6 11/20/2013 1251   HCT 33.9 (L) 04/01/2024 0901   HCT 42.2 11/20/2013 1251   PLT 276 04/01/2024 0901   PLT 201 11/20/2013 1251   MCV 90.4 04/01/2024 0901   MCV 87.9 11/20/2013 1251   MCH 29.3 04/01/2024 0901   MCHC 32.4 04/01/2024 0901   RDW 17.2 (H) 04/01/2024 0901   RDW 14.6 (H) 11/20/2013 1251   LYMPHSABS 0.9 04/01/2024 0901   LYMPHSABS 1.3 11/20/2013 1251   MONOABS 0.7 04/01/2024 0901   MONOABS 0.3 11/20/2013 1251   EOSABS 0.0 04/01/2024 0901   EOSABS 0.1 11/20/2013 1251   BASOSABS 0.0 04/01/2024 0901   BASOSABS 0.0 11/20/2013 1251    CMP     Component Value Date/Time   NA 141  04/01/2024 0901   NA 144 11/20/2013 1251   K 4.1 04/01/2024 0901   K 4.0 11/20/2013 1251   CL 106 04/01/2024 0901   CL 107 03/20/2012 1257   CO2 24 04/01/2024 0901   CO2 28 11/20/2013 1251   GLUCOSE 135 (H) 04/01/2024 0901   GLUCOSE 141 (H) 11/20/2013 1251   GLUCOSE 92 03/20/2012 1257   BUN 12 04/01/2024 0901   BUN 13.3 11/20/2013 1251   CREATININE 0.77 04/01/2024 0901   CREATININE 0.9 11/20/2013 1251   CALCIUM 9.8 04/01/2024 0901   CALCIUM 9.6 11/20/2013 1251   PROT 6.7 04/01/2024 0901   PROT 6.8 11/20/2013 1251   ALBUMIN 4.1 04/01/2024 0901   ALBUMIN 3.6 11/20/2013 1251   AST 18 04/01/2024 0901   AST 16 11/20/2013 1251   ALT 8 04/01/2024 0901   ALT 13 11/20/2013 1251   ALKPHOS 65 04/01/2024 0901   ALKPHOS 72 11/20/2013 1251   BILITOT 0.4 04/01/2024 0901   BILITOT 0.35 11/20/2013 1251   GFRNONAA >60 04/01/2024 0901   GFRAA  12/05/2007 1604    >60        The eGFR has been calculated using the MDRD equation. This calculation has not been validated in all clinical     ASSESSMENT and THERAPY PLAN:     Assessment and Plan Assessment & Plan Stage I B ER/PR positive invasive carcinoma of the left breast, status post mastectomy and chemotherapy, on adjuvant radiation therapy  Bilateral lower extremity edema, evaluating for deep vein thrombosis New bilateral lower extremity edema, right more pronounced.  - Ordered lower extremity Doppler ultrasound for DVT evaluation. - Ordered laboratory studies including potassium. - Prescribed furosemide  PRN for up to three days, monitor  for hypokalemia. - Recommended sodium restriction, adequate hydration, compression stockings, and leg elevation. - If Doppler positive for DVT, initiate blood thinner such as Xarelto or Eliquis.    All questions were answered. The patient knows to call the clinic with any problems, questions or concerns. We can certainly see the patient much sooner if necessary.  Total encounter time:20  minutes*in face-to-face visit time, chart review, lab review, care coordination, order entry, and documentation of the encounter time.    Morna Kendall, NP 05/15/2024 11:01 AM Medical Oncology and Hematology [facility] [address] [place], [place] [address] Tel. [phone]    Fax. [phone]  *Total Encounter Time as defined by the Centers for Medicare and Medicaid Services includes, in addition to the face-to-face time of a patient visit (documented in the note above) non-face-to-face time: obtaining and reviewing outside history, ordering and reviewing medications, tests or procedures, care coordination (communications with other health care professionals or caregivers) and documentation in the medical record.

## 2024-05-16 ENCOUNTER — Ambulatory Visit
Admission: RE | Admit: 2024-05-16 | Discharge: 2024-05-16 | Disposition: A | Source: Ambulatory Visit | Attending: Radiation Oncology | Admitting: Radiation Oncology

## 2024-05-16 ENCOUNTER — Other Ambulatory Visit: Payer: Self-pay

## 2024-05-16 LAB — RAD ONC ARIA SESSION SUMMARY
Course Elapsed Days: 2
Plan Fractions Treated to Date: 2
Plan Fractions Treated to Date: 3
Plan Prescribed Dose Per Fraction: 2 Gy
Plan Prescribed Dose Per Fraction: 2 Gy
Plan Total Fractions Prescribed: 13
Plan Total Fractions Prescribed: 25
Plan Total Prescribed Dose: 26 Gy
Plan Total Prescribed Dose: 50 Gy
Reference Point Dosage Given to Date: 4 Gy
Reference Point Dosage Given to Date: 6 Gy
Reference Point Session Dosage Given: 2 Gy
Reference Point Session Dosage Given: 2 Gy
Session Number: 3

## 2024-05-19 ENCOUNTER — Ambulatory Visit
Admission: RE | Admit: 2024-05-19 | Discharge: 2024-05-19 | Disposition: A | Source: Ambulatory Visit | Attending: Radiation Oncology | Admitting: Radiation Oncology

## 2024-05-19 ENCOUNTER — Other Ambulatory Visit: Payer: Self-pay

## 2024-05-19 LAB — RAD ONC ARIA SESSION SUMMARY
Course Elapsed Days: 5
Plan Fractions Treated to Date: 2
Plan Fractions Treated to Date: 4
Plan Prescribed Dose Per Fraction: 2 Gy
Plan Prescribed Dose Per Fraction: 2 Gy
Plan Total Fractions Prescribed: 12
Plan Total Fractions Prescribed: 25
Plan Total Prescribed Dose: 24 Gy
Plan Total Prescribed Dose: 50 Gy
Reference Point Dosage Given to Date: 4 Gy
Reference Point Dosage Given to Date: 8 Gy
Reference Point Session Dosage Given: 2 Gy
Reference Point Session Dosage Given: 2 Gy
Session Number: 4

## 2024-05-20 ENCOUNTER — Ambulatory Visit
Admission: RE | Admit: 2024-05-20 | Discharge: 2024-05-20 | Disposition: A | Source: Ambulatory Visit | Attending: Radiation Oncology | Admitting: Radiation Oncology

## 2024-05-20 ENCOUNTER — Other Ambulatory Visit: Payer: Self-pay

## 2024-05-20 DIAGNOSIS — D0512 Intraductal carcinoma in situ of left breast: Secondary | ICD-10-CM

## 2024-05-20 LAB — RAD ONC ARIA SESSION SUMMARY
Course Elapsed Days: 6
Plan Fractions Treated to Date: 3
Plan Fractions Treated to Date: 5
Plan Prescribed Dose Per Fraction: 2 Gy
Plan Prescribed Dose Per Fraction: 2 Gy
Plan Total Fractions Prescribed: 13
Plan Total Fractions Prescribed: 25
Plan Total Prescribed Dose: 26 Gy
Plan Total Prescribed Dose: 50 Gy
Reference Point Dosage Given to Date: 10 Gy
Reference Point Dosage Given to Date: 6 Gy
Reference Point Session Dosage Given: 2 Gy
Reference Point Session Dosage Given: 2 Gy
Session Number: 5

## 2024-05-20 MED ORDER — ALRA NON-METALLIC DEODORANT (RAD-ONC)
1.0000 | Freq: Once | TOPICAL | Status: AC
  Administered 2024-05-20: 1 via TOPICAL

## 2024-05-20 MED ORDER — RADIAPLEXRX EX GEL: Freq: Once | CUTANEOUS | Status: AC

## 2024-05-21 ENCOUNTER — Ambulatory Visit
Admission: RE | Admit: 2024-05-21 | Discharge: 2024-05-21 | Disposition: A | Discharge status: Home / Self Care | Source: Ambulatory Visit | Attending: Radiation Oncology | Admitting: Radiation Oncology

## 2024-05-21 ENCOUNTER — Other Ambulatory Visit: Payer: Self-pay

## 2024-05-21 LAB — RAD ONC ARIA SESSION SUMMARY
Course Elapsed Days: 7
Plan Fractions Treated to Date: 3
Plan Fractions Treated to Date: 6
Plan Prescribed Dose Per Fraction: 2 Gy
Plan Prescribed Dose Per Fraction: 2 Gy
Plan Total Fractions Prescribed: 12
Plan Total Fractions Prescribed: 25
Plan Total Prescribed Dose: 24 Gy
Plan Total Prescribed Dose: 50 Gy
Reference Point Dosage Given to Date: 12 Gy
Reference Point Dosage Given to Date: 6 Gy
Reference Point Session Dosage Given: 2 Gy
Reference Point Session Dosage Given: 2 Gy
Session Number: 6

## 2024-05-22 ENCOUNTER — Ambulatory Visit
Admission: RE | Admit: 2024-05-22 | Discharge: 2024-05-22 | Disposition: A | Source: Ambulatory Visit | Attending: Radiation Oncology | Admitting: Radiation Oncology

## 2024-05-22 ENCOUNTER — Other Ambulatory Visit: Payer: Self-pay

## 2024-05-22 LAB — RAD ONC ARIA SESSION SUMMARY
Course Elapsed Days: 8
Plan Fractions Treated to Date: 4
Plan Fractions Treated to Date: 7
Plan Prescribed Dose Per Fraction: 2 Gy
Plan Prescribed Dose Per Fraction: 2 Gy
Plan Total Fractions Prescribed: 13
Plan Total Fractions Prescribed: 25
Plan Total Prescribed Dose: 26 Gy
Plan Total Prescribed Dose: 50 Gy
Reference Point Dosage Given to Date: 14 Gy
Reference Point Dosage Given to Date: 8 Gy
Reference Point Session Dosage Given: 2 Gy
Reference Point Session Dosage Given: 2 Gy
Session Number: 7

## 2024-05-23 ENCOUNTER — Ambulatory Visit

## 2024-05-25 ENCOUNTER — Encounter: Payer: Self-pay | Admitting: Adult Health

## 2024-05-26 ENCOUNTER — Ambulatory Visit
Admission: RE | Admit: 2024-05-26 | Discharge: 2024-05-26 | Disposition: A | Source: Ambulatory Visit | Attending: Radiation Oncology | Admitting: Radiation Oncology

## 2024-05-26 ENCOUNTER — Other Ambulatory Visit: Payer: Self-pay

## 2024-05-26 LAB — RAD ONC ARIA SESSION SUMMARY
Course Elapsed Days: 12
Plan Fractions Treated to Date: 4
Plan Fractions Treated to Date: 8
Plan Prescribed Dose Per Fraction: 2 Gy
Plan Prescribed Dose Per Fraction: 2 Gy
Plan Total Fractions Prescribed: 12
Plan Total Fractions Prescribed: 25
Plan Total Prescribed Dose: 24 Gy
Plan Total Prescribed Dose: 50 Gy
Reference Point Dosage Given to Date: 16 Gy
Reference Point Dosage Given to Date: 8 Gy
Reference Point Session Dosage Given: 2 Gy
Reference Point Session Dosage Given: 2 Gy
Session Number: 8

## 2024-05-27 ENCOUNTER — Other Ambulatory Visit: Payer: Self-pay

## 2024-05-27 ENCOUNTER — Ambulatory Visit
Admission: RE | Admit: 2024-05-27 | Discharge: 2024-05-27 | Disposition: A | Source: Ambulatory Visit | Attending: Radiation Oncology | Admitting: Radiation Oncology

## 2024-05-27 LAB — RAD ONC ARIA SESSION SUMMARY
Course Elapsed Days: 13
Plan Fractions Treated to Date: 5
Plan Fractions Treated to Date: 9
Plan Prescribed Dose Per Fraction: 2 Gy
Plan Prescribed Dose Per Fraction: 2 Gy
Plan Total Fractions Prescribed: 13
Plan Total Fractions Prescribed: 25
Plan Total Prescribed Dose: 26 Gy
Plan Total Prescribed Dose: 50 Gy
Reference Point Dosage Given to Date: 10 Gy
Reference Point Dosage Given to Date: 18 Gy
Reference Point Session Dosage Given: 2 Gy
Reference Point Session Dosage Given: 2 Gy
Session Number: 9

## 2024-05-28 ENCOUNTER — Other Ambulatory Visit: Payer: Self-pay

## 2024-05-28 ENCOUNTER — Ambulatory Visit
Admission: RE | Admit: 2024-05-28 | Discharge: 2024-05-28 | Disposition: A | Source: Ambulatory Visit | Attending: Radiation Oncology | Admitting: Radiation Oncology

## 2024-05-28 LAB — RAD ONC ARIA SESSION SUMMARY
Course Elapsed Days: 14
Plan Fractions Treated to Date: 10
Plan Fractions Treated to Date: 5
Plan Prescribed Dose Per Fraction: 2 Gy
Plan Prescribed Dose Per Fraction: 2 Gy
Plan Total Fractions Prescribed: 12
Plan Total Fractions Prescribed: 25
Plan Total Prescribed Dose: 24 Gy
Plan Total Prescribed Dose: 50 Gy
Reference Point Dosage Given to Date: 10 Gy
Reference Point Dosage Given to Date: 20 Gy
Reference Point Session Dosage Given: 2 Gy
Reference Point Session Dosage Given: 2 Gy
Session Number: 10

## 2024-05-29 ENCOUNTER — Ambulatory Visit
Admission: RE | Admit: 2024-05-29 | Discharge: 2024-05-29 | Disposition: A | Discharge status: Home / Self Care | Source: Ambulatory Visit | Attending: Radiation Oncology | Admitting: Radiation Oncology

## 2024-05-29 ENCOUNTER — Other Ambulatory Visit: Payer: Self-pay

## 2024-05-29 ENCOUNTER — Inpatient Hospital Stay: Admitting: Hematology and Oncology

## 2024-05-29 VITALS — BP 149/68 | HR 74 | Temp 97.9°F | Resp 18 | Wt 236.7 lb

## 2024-05-29 DIAGNOSIS — Z170 Estrogen receptor positive status [ER+]: Secondary | ICD-10-CM | POA: Diagnosis not present

## 2024-05-29 DIAGNOSIS — C50812 Malignant neoplasm of overlapping sites of left female breast: Secondary | ICD-10-CM | POA: Diagnosis not present

## 2024-05-29 LAB — RAD ONC ARIA SESSION SUMMARY
Course Elapsed Days: 15
Plan Fractions Treated to Date: 11
Plan Fractions Treated to Date: 6
Plan Prescribed Dose Per Fraction: 2 Gy
Plan Prescribed Dose Per Fraction: 2 Gy
Plan Total Fractions Prescribed: 13
Plan Total Fractions Prescribed: 25
Plan Total Prescribed Dose: 26 Gy
Plan Total Prescribed Dose: 50 Gy
Reference Point Dosage Given to Date: 12 Gy
Reference Point Dosage Given to Date: 22 Gy
Reference Point Session Dosage Given: 2 Gy
Reference Point Session Dosage Given: 2 Gy
Session Number: 11

## 2024-05-29 MED ORDER — LETROZOLE 2.5 MG PO TABS: 2.5000 mg | ORAL_TABLET | Freq: Every day | ORAL | 3 refills | Status: AC

## 2024-05-29 NOTE — Progress Notes (Signed)
 [facility] Cancer Follow up:    Lynwood Laneta ORN, PA-C [address] [place] [address]   DIAGNOSIS:  Cancer Staging  Malignant neoplasm of overlapping sites of left breast in female, estrogen receptor positive (HCC) Staging form: Breast, AJCC 8th Edition - Pathologic: Stage IB (pT3, pN2a(sn), cM0, G2, ER+, PR+, HER2-) - Signed by Loretha Ash, MD on 12/24/2023 Method of lymph node assessment: Sentinel lymph node biopsy Multigene prognostic tests performed: None Histologic grading system: 3 grade system    SUMMARY OF ONCOLOGIC HISTORY: Oncology History  Malignant neoplasm of overlapping sites of left breast in female, estrogen receptor positive (HCC)  11/13/2023 Mammogram   Mammogram with asymmetric increase in fibroglandular tissue in left breast which is indeterminate.  No sonographic correlate or abnormality to account for the asymmetric increase in the glandular tissue in the left breast.  Given this can be a subtle presentation of invasive lobular carcinoma contrast-enhanced mammogram is recommended  Contrast-enhanced imaging was successful and without incident this showed scattered non-mass enhancement throughout the left breast, anterior to middle depth.   11/21/2023 Pathology Results   Left breast needle core biopsy at 12:00 4 cm from the nipple showed overall grade 2 invasive mammary carcinoma. Left breast needle core biopsy retroareolar at 4:00 showed overall grade 2 invasive mammary carcinoma prognostic showed ER 95% positive strong staining intensity PR 50% positive staining intensity HER2 0 and Ki-67 of 20%   11/27/2023 Initial Diagnosis   Malignant neoplasm of overlapping sites of left breast in female, estrogen receptor positive (HCC)    Genetic Testing   [facility] CancerNext Panel+RNA was Negative. Report date is 12/09/2023.  The [facility] CancerNext+RNAinsight Panel includes sequencing, rearrangement analysis, and RNA analysis for the following 40 genes:  APC, ATM, BAP1, BARD1, BMPR1A, BRCA1, BRCA2, BRIP1, CDH1, CDKN2A, CHEK2, FH, FLCN, MET, MLH1, MSH2, MSH6, MUTYH, NF1, NTHL1, PALB2, PMS2, PTEN, RAD51C, RAD51D, RPS20, SMAD4, STK11, TP53, TSC1, TSC2, and VHL (sequencing and deletion/duplication); AXIN2, HOXB13, MBD4, MSH3, POLD1 and POLE (sequencing only); EPCAM and GREM1 (deletion/duplication only).    11/28/2023 Cancer Staging   Staging form: Breast, AJCC 8th Edition - Pathologic: Stage IB (pT3, pN2a(sn), cM0, G2, ER+, PR+, HER2-) - Signed by Loretha Ash, MD on 12/24/2023 Method of lymph node assessment: Sentinel lymph node biopsy Multigene prognostic tests performed: None Histologic grading system: 3 grade system   12/13/2023 Surgery   Left mastectomy: ILC, 6.2cm, grade 2, 8 LN with macrometastases.  T3 N2a ER/PR positive, HER2 negative, Ki67 20%.    01/25/2024 - 04/04/2024 Chemotherapy   Patient is on Treatment Plan : BREAST TC q21d     05/14/2024 - 06/24/2024 Radiation Therapy   Adjuvant radiation therapy     CURRENT THERAPY: taxotere /cytoxan   INTERVAL HISTORY:  Discussed the use of AI scribe software for clinical note transcription with the patient, who gave verbal consent to proceed.  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  She is currently undergoing adjuvant radiation therapy for invasive lobular carcinoma of the left breast, following completion of chemotherapy. She is currently undergoing adjuvant radiation therapy for invasive lobular carcinoma of the left breast, following completion of chemotherapy. She expects to complete this phase by February 17th or 18th. She denies new symptoms related to her malignancy or its treatment and reports feeling back to normal.  She developed new onset  bilateral pedal and ankle edema a  few days before Christmas, which was a first-time occurrence. Doppler ultrasound was negative for deep vein thrombosis. She took a single dose of furosemide , resulting in significant improvement of the edema. Currently, her feet and ankles are normal, with only mild residual changes in venous prominence upon standing. She has not experienced recurrence of the edema.  Rest of the pertinent 10 point ROS reviewed and neg  Patient Active Problem List   Diagnosis Date Noted   Genetic testing 12/10/2023   Malignant neoplasm of overlapping sites of left breast in female, estrogen receptor positive (HCC) 11/27/2023   Well woman exam with routine gynecological exam 09/06/2023   Mixed incontinence 09/06/2023   Arthritis of right knee 11/24/2020   Benign neoplasm of skin of face 11/24/2020   Dermatitis 11/24/2020   Primary osteoarthritis of first carpometacarpal joint of right hand 04/07/2020   Rupture of extensor tendon of right hand 04/07/2020   Scapholunate dissociation, right 04/07/2020   Chronic pain of right knee 12/10/2019   Pure hypercholesterolemia 05/14/2018   Hallux valgus 11/25/2015   Reactive airway disease 10/18/2015   Obesity 10/18/2015   Inflamed seborrheic keratosis 10/18/2015   History of breast cancer 10/18/2015   DCIS (ductal carcinoma in situ)     has no known allergies.  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; s/p lumpectomy; s/p adj XRT; on adj Tamoxifen    Hypertension    Sinus problem     SURGICAL HISTORY: Past Surgical History:  Procedure Laterality Date   BREAST LUMPECTOMY Left 05/09/2007   CARPAL TUNNEL RELEASE Bilateral 1993 & 2003   MASTECTOMY W/ SENTINEL NODE BIOPSY Left 12/13/2023   Procedure: MASTECTOMY WITH SENTINEL LYMPH NODE BIOPSY;  Surgeon: Vernetta Berg, MD;  Location: [facility];  Service: General;  Laterality:  Left;  LEFT BREAST MASTECTOMY AND SENTINEL NODE BIOPSY   TONSILLECTOMY  age 64     SOCIAL HISTORY: Social History   Socioeconomic History   Marital status: Single    Spouse name: Not on file   Number of children: 0   Years of education: Not on file   Highest education level: Not on file  Occupational History   Not on file  Tobacco Use   Smoking status: Never   Smokeless tobacco: Never  Vaping Use   Vaping status: Never Used  Substance and Sexual Activity   Alcohol use: Yes    Alcohol/week: 0.0 - 1.0 standard drinks of alcohol    Comment: occasionally   Drug use: No   Sexual activity: Not Currently    Partners: Male    Birth control/protection: Post-menopausal  Other Topics Concern   Not on file  Social History Narrative   Not on file   Social Drivers of Health   Tobacco Use: Low Risk (05/15/2024)   Patient History    Smoking Tobacco Use: Never    Smokeless Tobacco Use: Never    Passive Exposure: Not on file  Financial Resource Strain: Low Risk (05/15/2024)   Overall Financial Resource Strain (CARDIA)    Difficulty of Paying Living Expenses: Not hard at all  Food Insecurity: No Food Insecurity (05/15/2024)   Epic    Worried About Programme Researcher, Broadcasting/film/video in the Last Year: Never true    Ran Out of Food in the Last Year: Never true  Transportation Needs: No  Transportation Needs (01/31/2024)   Received from [employer]    In the past 12 months, has lack of reliable transportation kept you from medical appointments, meetings, work or from getting things needed for daily living? : No  Physical Activity: Not on file  Stress: No Stress Concern Present (05/15/2024)   [employer] of Occupational Health - Occupational Stress Questionnaire    Feeling of Stress: Only a little  Social Connections: Moderately Isolated (05/15/2024)   Social Connection and Isolation Panel    Frequency of Communication with Friends and Family: More than three times a week    Frequency  of Social Gatherings with Friends and Family: More than three times a week    Attends Religious Services: Never    Database Administrator or Organizations: Yes    Attends Engineer, Structural: More than 4 times per year    Marital Status: Never married  Intimate Partner Violence: Not At Risk (05/15/2024)   Epic    Fear of Current or Ex-Partner: No    Emotionally Abused: No    Physically Abused: No    Sexually Abused: No  Depression (PHQ2-9): Low Risk (05/15/2024)   Depression (PHQ2-9)    PHQ-2 Score: 0  Alcohol Screen: Low Risk (05/15/2024)   Alcohol Screen    Last Alcohol Screening Score (AUDIT): 1  Housing: Unknown (05/15/2024)   Epic    Unable to Pay for Housing in the Last Year: No    Number of Times Moved in the Last Year: Not on file    Homeless in the Last Year: No  Utilities: Not At Risk (05/15/2024)   Epic    Threatened with loss of utilities: No  Health Literacy: Adequate Health Literacy (05/15/2024)   B1300 Health Literacy    Frequency of need for help with medical instructions: Never    FAMILY HISTORY: Family History  Problem Relation Age of Onset   Diabetes Mother    Heart disease Mother    Heart disease Father    Heart failure Father    Prostate cancer Brother 35   Lung cancer Paternal Aunt 45       smoked   Multiple myeloma Paternal Uncle    Prostate cancer Maternal Cousin    Colon cancer Neg Hx     Review of Systems  Constitutional:  Negative for appetite change, chills, fatigue, fever and unexpected weight change.  HENT:   Negative for hearing loss, lump/mass and trouble swallowing.   Eyes:  Negative for eye problems and icterus.  Respiratory:  Negative for chest tightness, cough and shortness of breath.   Cardiovascular:  Negative for chest pain, leg swelling and palpitations.  Gastrointestinal:  Negative for abdominal distention, abdominal pain, constipation, diarrhea, nausea and vomiting.  Endocrine: Negative for hot flashes.  Genitourinary:   Negative for difficulty urinating.   Musculoskeletal:  Negative for arthralgias.  Skin:  Negative for itching and rash.  Neurological:  Negative for dizziness, extremity weakness, headaches and numbness.  Hematological:  Negative for adenopathy. Does not bruise/bleed easily.  Psychiatric/Behavioral:  Negative for depression. The patient is not nervous/anxious.       PHYSICAL EXAMINATION    Vitals:   05/29/24 0835  BP: (!) 149/68  Pulse: 74  Resp: 18  Temp: 97.9 F (36.6 C)   She appears well, no acute distress.  LABORATORY DATA:  CBC    Component Value Date/Time   WBC 4.0 05/15/2024 1127   WBC 4.3 11/20/2013 1251  WBC 5.5 12/05/2007 1604   RBC 4.17 05/15/2024 1127   HGB 12.3 05/15/2024 1127   HGB 14.7 01/12/2015 1057   HGB 13.6 11/20/2013 1251   HCT 39.2 05/15/2024 1127   HCT 42.2 11/20/2013 1251   PLT 176 05/15/2024 1127   PLT 201 11/20/2013 1251   MCV 94.0 05/15/2024 1127   MCV 87.9 11/20/2013 1251   MCH 29.5 05/15/2024 1127   MCHC 31.4 05/15/2024 1127   RDW 15.5 05/15/2024 1127   RDW 14.6 (H) 11/20/2013 1251   LYMPHSABS 1.2 05/15/2024 1127   LYMPHSABS 1.3 11/20/2013 1251   MONOABS 0.4 05/15/2024 1127   MONOABS 0.3 11/20/2013 1251   EOSABS 0.2 05/15/2024 1127   EOSABS 0.1 11/20/2013 1251   BASOSABS 0.0 05/15/2024 1127   BASOSABS 0.0 11/20/2013 1251    CMP     Component Value Date/Time   NA 141 05/15/2024 1127   NA 144 11/20/2013 1251   K 4.7 05/15/2024 1127   K 4.0 11/20/2013 1251   CL 105 05/15/2024 1127   CL 107 03/20/2012 1257   CO2 29 05/15/2024 1127   CO2 28 11/20/2013 1251   GLUCOSE 93 05/15/2024 1127   GLUCOSE 141 (H) 11/20/2013 1251   GLUCOSE 92 03/20/2012 1257   BUN 12 05/15/2024 1127   BUN 13.3 11/20/2013 1251   CREATININE 0.74 05/15/2024 1127   CREATININE 0.9 11/20/2013 1251   CALCIUM 9.6 05/15/2024 1127   CALCIUM 9.6 11/20/2013 1251   PROT 6.8 05/15/2024 1127   PROT 6.8 11/20/2013 1251   ALBUMIN 4.1 05/15/2024 1127   ALBUMIN  3.6 11/20/2013 1251   AST 20 05/15/2024 1127   AST 16 11/20/2013 1251   ALT 14 05/15/2024 1127   ALT 13 11/20/2013 1251   ALKPHOS 70 05/15/2024 1127   ALKPHOS 72 11/20/2013 1251   BILITOT 0.5 05/15/2024 1127   BILITOT 0.35 11/20/2013 1251   GFRNONAA >60 05/15/2024 1127   GFRAA  12/05/2007 1604    >60        The eGFR has been calculated using the MDRD equation. This calculation has not been validated in all clinical     ASSESSMENT and THERAPY PLAN:  Assessment & Plan  Invasive lobular carcinoma of the left breast, high risk, post-chemotherapy High-risk invasive lobular carcinoma with eight positive lymph nodes, post-chemotherapy. Eligible for adjuvant endocrine and CDK4/6 inhibitor therapy.  - Prescribed letrozole  to start immediately, prior to radiation completion. - Plan to start Verzenio 100 mg twice daily post-radiation, with dose adjustments as needed. - Coordinated with radiation oncologist for concurrent letrozole  and radiation. - Scheduled follow-up in early March to start Verzenio and monitor therapy. - Plan MRD testing biannually for minimal residual disease/cancer recurrence monitoring, starting post-March 1st. - Continue regular blood work for surveillance. - Continue annual mammography of the right breast. - Instructed to schedule next appointment in March.   All questions were answered. The patient knows to call the clinic with any problems, questions or concerns. We can certainly see the patient much sooner if necessary.  Total encounter time:30 minutes*in face-to-face visit time, chart review, lab review, care coordination, order entry, and documentation of the encounter time.   *Total Encounter Time as defined by the Centers for Medicare and Medicaid Services includes, in addition to the face-to-face time of a patient visit (documented in the note above) non-face-to-face time: obtaining and reviewing outside history, ordering and reviewing medications, tests  or procedures, care coordination (communications with other health care professionals or caregivers) and documentation  in the medical record.

## 2024-05-30 ENCOUNTER — Other Ambulatory Visit: Payer: Self-pay

## 2024-05-30 ENCOUNTER — Ambulatory Visit
Admission: RE | Admit: 2024-05-30 | Discharge: 2024-05-30 | Disposition: A | Source: Ambulatory Visit | Attending: Radiation Oncology | Admitting: Radiation Oncology

## 2024-05-30 LAB — RAD ONC ARIA SESSION SUMMARY
Course Elapsed Days: 16
Plan Fractions Treated to Date: 12
Plan Fractions Treated to Date: 6
Plan Prescribed Dose Per Fraction: 2 Gy
Plan Prescribed Dose Per Fraction: 2 Gy
Plan Total Fractions Prescribed: 12
Plan Total Fractions Prescribed: 25
Plan Total Prescribed Dose: 24 Gy
Plan Total Prescribed Dose: 50 Gy
Reference Point Dosage Given to Date: 12 Gy
Reference Point Dosage Given to Date: 24 Gy
Reference Point Session Dosage Given: 2 Gy
Reference Point Session Dosage Given: 2 Gy
Session Number: 12

## 2024-06-02 ENCOUNTER — Ambulatory Visit

## 2024-06-03 ENCOUNTER — Ambulatory Visit

## 2024-06-04 ENCOUNTER — Other Ambulatory Visit: Payer: Self-pay

## 2024-06-04 ENCOUNTER — Ambulatory Visit
Admission: RE | Admit: 2024-06-04 | Discharge: 2024-06-04 | Attending: Radiation Oncology | Admitting: Radiation Oncology

## 2024-06-04 ENCOUNTER — Ambulatory Visit
Admission: RE | Admit: 2024-06-04 | Discharge: 2024-06-04 | Disposition: A | Source: Ambulatory Visit | Attending: Radiation Oncology | Admitting: Radiation Oncology

## 2024-06-04 LAB — RAD ONC ARIA SESSION SUMMARY
Course Elapsed Days: 21
Plan Fractions Treated to Date: 13
Plan Fractions Treated to Date: 7
Plan Prescribed Dose Per Fraction: 2 Gy
Plan Prescribed Dose Per Fraction: 2 Gy
Plan Total Fractions Prescribed: 13
Plan Total Fractions Prescribed: 25
Plan Total Prescribed Dose: 26 Gy
Plan Total Prescribed Dose: 50 Gy
Reference Point Dosage Given to Date: 14 Gy
Reference Point Dosage Given to Date: 26 Gy
Reference Point Session Dosage Given: 2 Gy
Reference Point Session Dosage Given: 2 Gy
Session Number: 13

## 2024-06-05 ENCOUNTER — Ambulatory Visit
Admission: RE | Admit: 2024-06-05 | Discharge: 2024-06-05 | Disposition: A | Discharge status: Home / Self Care | Source: Ambulatory Visit | Attending: Radiation Oncology | Admitting: Radiation Oncology

## 2024-06-05 ENCOUNTER — Other Ambulatory Visit: Payer: Self-pay

## 2024-06-05 LAB — RAD ONC ARIA SESSION SUMMARY
Course Elapsed Days: 22
Plan Fractions Treated to Date: 14
Plan Fractions Treated to Date: 7
Plan Prescribed Dose Per Fraction: 2 Gy
Plan Prescribed Dose Per Fraction: 2 Gy
Plan Total Fractions Prescribed: 12
Plan Total Fractions Prescribed: 25
Plan Total Prescribed Dose: 24 Gy
Plan Total Prescribed Dose: 50 Gy
Reference Point Dosage Given to Date: 14 Gy
Reference Point Dosage Given to Date: 28 Gy
Reference Point Session Dosage Given: 2 Gy
Reference Point Session Dosage Given: 2 Gy
Session Number: 14

## 2024-06-06 ENCOUNTER — Ambulatory Visit
Admission: RE | Admit: 2024-06-06 | Discharge: 2024-06-06 | Disposition: A | Source: Ambulatory Visit | Attending: Radiation Oncology | Admitting: Radiation Oncology

## 2024-06-06 ENCOUNTER — Other Ambulatory Visit: Payer: Self-pay

## 2024-06-06 LAB — RAD ONC ARIA SESSION SUMMARY
Course Elapsed Days: 23
Plan Fractions Treated to Date: 15
Plan Fractions Treated to Date: 8
Plan Prescribed Dose Per Fraction: 2 Gy
Plan Prescribed Dose Per Fraction: 2 Gy
Plan Total Fractions Prescribed: 13
Plan Total Fractions Prescribed: 25
Plan Total Prescribed Dose: 26 Gy
Plan Total Prescribed Dose: 50 Gy
Reference Point Dosage Given to Date: 16 Gy
Reference Point Dosage Given to Date: 30 Gy
Reference Point Session Dosage Given: 2 Gy
Reference Point Session Dosage Given: 2 Gy
Session Number: 15

## 2024-06-09 ENCOUNTER — Ambulatory Visit

## 2024-06-10 ENCOUNTER — Ambulatory Visit
Admission: RE | Admit: 2024-06-10 | Discharge: 2024-06-10 | Disposition: A | Source: Ambulatory Visit | Attending: Radiation Oncology | Admitting: Radiation Oncology

## 2024-06-10 ENCOUNTER — Ambulatory Visit: Admission: RE | Admit: 2024-06-10 | Admitting: Radiation Oncology

## 2024-06-10 ENCOUNTER — Ambulatory Visit: Admitting: Radiation Oncology

## 2024-06-10 ENCOUNTER — Other Ambulatory Visit: Payer: Self-pay

## 2024-06-10 DIAGNOSIS — D0512 Intraductal carcinoma in situ of left breast: Secondary | ICD-10-CM

## 2024-06-10 LAB — RAD ONC ARIA SESSION SUMMARY
Course Elapsed Days: 27
Plan Fractions Treated to Date: 16
Plan Fractions Treated to Date: 8
Plan Prescribed Dose Per Fraction: 2 Gy
Plan Prescribed Dose Per Fraction: 2 Gy
Plan Total Fractions Prescribed: 12
Plan Total Fractions Prescribed: 25
Plan Total Prescribed Dose: 24 Gy
Plan Total Prescribed Dose: 50 Gy
Reference Point Dosage Given to Date: 16 Gy
Reference Point Dosage Given to Date: 32 Gy
Reference Point Session Dosage Given: 2 Gy
Reference Point Session Dosage Given: 2 Gy
Session Number: 16

## 2024-06-10 MED ORDER — RADIAPLEXRX EX GEL: Freq: Once | CUTANEOUS | Status: AC

## 2024-06-11 ENCOUNTER — Ambulatory Visit
Admission: RE | Admit: 2024-06-11 | Discharge: 2024-06-11 | Disposition: A | Source: Ambulatory Visit | Attending: Radiation Oncology | Admitting: Radiation Oncology

## 2024-06-11 ENCOUNTER — Other Ambulatory Visit: Payer: Self-pay

## 2024-06-11 LAB — RAD ONC ARIA SESSION SUMMARY
Course Elapsed Days: 28
Plan Fractions Treated to Date: 17
Plan Fractions Treated to Date: 9
Plan Prescribed Dose Per Fraction: 2 Gy
Plan Prescribed Dose Per Fraction: 2 Gy
Plan Total Fractions Prescribed: 13
Plan Total Fractions Prescribed: 25
Plan Total Prescribed Dose: 26 Gy
Plan Total Prescribed Dose: 50 Gy
Reference Point Dosage Given to Date: 18 Gy
Reference Point Dosage Given to Date: 34 Gy
Reference Point Session Dosage Given: 2 Gy
Reference Point Session Dosage Given: 2 Gy
Session Number: 17

## 2024-06-12 ENCOUNTER — Ambulatory Visit
Admission: RE | Admit: 2024-06-12 | Discharge: 2024-06-12 | Disposition: A | Source: Ambulatory Visit | Attending: Radiation Oncology | Admitting: Radiation Oncology

## 2024-06-12 ENCOUNTER — Other Ambulatory Visit: Payer: Self-pay

## 2024-06-12 LAB — RAD ONC ARIA SESSION SUMMARY
Course Elapsed Days: 29
Plan Fractions Treated to Date: 18
Plan Fractions Treated to Date: 9
Plan Prescribed Dose Per Fraction: 2 Gy
Plan Prescribed Dose Per Fraction: 2 Gy
Plan Total Fractions Prescribed: 12
Plan Total Fractions Prescribed: 25
Plan Total Prescribed Dose: 24 Gy
Plan Total Prescribed Dose: 50 Gy
Reference Point Dosage Given to Date: 18 Gy
Reference Point Dosage Given to Date: 36 Gy
Reference Point Session Dosage Given: 2 Gy
Reference Point Session Dosage Given: 2 Gy
Session Number: 18

## 2024-06-13 ENCOUNTER — Ambulatory Visit: Admission: RE | Admit: 2024-06-13 | Source: Ambulatory Visit

## 2024-06-13 ENCOUNTER — Other Ambulatory Visit: Payer: Self-pay

## 2024-06-13 LAB — RAD ONC ARIA SESSION SUMMARY
Course Elapsed Days: 30
Plan Fractions Treated to Date: 10
Plan Fractions Treated to Date: 19
Plan Prescribed Dose Per Fraction: 2 Gy
Plan Prescribed Dose Per Fraction: 2 Gy
Plan Total Fractions Prescribed: 13
Plan Total Fractions Prescribed: 25
Plan Total Prescribed Dose: 26 Gy
Plan Total Prescribed Dose: 50 Gy
Reference Point Dosage Given to Date: 20 Gy
Reference Point Dosage Given to Date: 38 Gy
Reference Point Session Dosage Given: 2 Gy
Reference Point Session Dosage Given: 2 Gy
Session Number: 19

## 2024-06-16 ENCOUNTER — Ambulatory Visit: Attending: Surgery

## 2024-06-16 ENCOUNTER — Ambulatory Visit

## 2024-06-17 ENCOUNTER — Ambulatory Visit: Admitting: Radiation Oncology

## 2024-06-17 ENCOUNTER — Ambulatory Visit

## 2024-06-18 ENCOUNTER — Ambulatory Visit

## 2024-06-19 ENCOUNTER — Ambulatory Visit

## 2024-06-20 ENCOUNTER — Ambulatory Visit

## 2024-06-23 ENCOUNTER — Ambulatory Visit

## 2024-06-24 ENCOUNTER — Ambulatory Visit

## 2024-06-25 ENCOUNTER — Ambulatory Visit

## 2024-06-26 ENCOUNTER — Ambulatory Visit

## 2024-06-27 ENCOUNTER — Ambulatory Visit

## 2024-06-30 ENCOUNTER — Ambulatory Visit

## 2024-07-10 ENCOUNTER — Inpatient Hospital Stay: Attending: Internal Medicine | Admitting: Hematology and Oncology

## 2024-09-08 ENCOUNTER — Ambulatory Visit: Admitting: Obstetrics and Gynecology

## 2024-10-02 ENCOUNTER — Inpatient Hospital Stay: Admitting: Adult Health

## 2024-10-02 ENCOUNTER — Inpatient Hospital Stay: Attending: Internal Medicine
